# Patient Record
Sex: Female | Born: 1975 | Race: Black or African American | Hispanic: No | Marital: Single | Smoking: Never smoker
Health system: Southern US, Community
[De-identification: ages and names within clinical notes are randomized; demographics above are authoritative.]

## PROBLEM LIST (undated history)

## (undated) DIAGNOSIS — E119 Type 2 diabetes mellitus without complications: Secondary | ICD-10-CM

## (undated) DIAGNOSIS — D219 Benign neoplasm of connective and other soft tissue, unspecified: Secondary | ICD-10-CM

## (undated) DIAGNOSIS — I10 Essential (primary) hypertension: Secondary | ICD-10-CM

## (undated) DIAGNOSIS — K219 Gastro-esophageal reflux disease without esophagitis: Secondary | ICD-10-CM

## (undated) HISTORY — PX: COLONOSCOPY: SHX174

## (undated) HISTORY — PX: RECTAL POLYPECTOMY: SHX2309

## (undated) HISTORY — PX: MYOMECTOMY: SHX85

## (undated) HISTORY — DX: Gastro-esophageal reflux disease without esophagitis: K21.9

## (undated) HISTORY — DX: Benign neoplasm of connective and other soft tissue, unspecified: D21.9

---

## 2017-09-16 ENCOUNTER — Encounter (HOSPITAL_COMMUNITY): Payer: Self-pay | Admitting: Emergency Medicine

## 2017-09-16 ENCOUNTER — Ambulatory Visit (HOSPITAL_COMMUNITY)
Admission: EM | Admit: 2017-09-16 | Discharge: 2017-09-16 | Disposition: A | Discharge status: Home / Self Care | Payer: 59

## 2017-09-16 DIAGNOSIS — R030 Elevated blood-pressure reading, without diagnosis of hypertension: Secondary | ICD-10-CM

## 2017-09-16 DIAGNOSIS — I10 Essential (primary) hypertension: Secondary | ICD-10-CM | POA: Diagnosis not present

## 2017-09-16 DIAGNOSIS — E1165 Type 2 diabetes mellitus with hyperglycemia: Secondary | ICD-10-CM

## 2017-09-16 DIAGNOSIS — IMO0001 Reserved for inherently not codable concepts without codable children: Secondary | ICD-10-CM

## 2017-09-16 DIAGNOSIS — L0291 Cutaneous abscess, unspecified: Secondary | ICD-10-CM

## 2017-09-16 HISTORY — DX: Essential (primary) hypertension: I10

## 2017-09-16 HISTORY — DX: Type 2 diabetes mellitus without complications: E11.9

## 2017-09-16 MED ORDER — SULFAMETHOXAZOLE-TRIMETHOPRIM 800-160 MG PO TABS: 1.0000 | ORAL_TABLET | Freq: Two times a day (BID) | ORAL | 0 refills | Status: DC

## 2017-09-16 MED ORDER — LISINOPRIL 20 MG PO TABS: 20.0000 mg | ORAL_TABLET | Freq: Two times a day (BID) | ORAL | 0 refills | Status: DC

## 2017-09-16 MED ORDER — METOPROLOL TARTRATE 25 MG PO TABS: 25.0000 mg | ORAL_TABLET | Freq: Two times a day (BID) | ORAL | 0 refills | Status: DC

## 2017-09-16 MED FILL — METOPROLOL TARTRATE 25 MG T: 25 | 90 days supply | Qty: 180 | Fill #0

## 2017-09-16 MED FILL — SULFAMETHOXAZOLE-TMP DS TAB: 800-160 | 10 days supply | Qty: 20 | Fill #0

## 2017-09-16 MED FILL — LISINOPRIL 20 MG TABLET: 20 | 90 days supply | Qty: 180 | Fill #0

## 2017-09-16 NOTE — Discharge Instructions (Addendum)
Jaynee Eagles, PA-C [facility]

## 2017-09-16 NOTE — ED Triage Notes (Addendum)
Pt here for abscess to left inner thigh that is draining but still has some hardness around area; pt needs htn meds refilled

## 2017-09-16 NOTE — ED Provider Notes (Signed)
  MRN: [id number] DOB: 29-Sep-1975  Subjective:   Jeanette Meyer is a 42 y.o. female presenting for 6 day history of progressively worsening skin infection of her left thigh. Patient reports that the infection has been draining, is painful. She had another infection ~3 weeks ago that resolved on its own. Denies fever, chest pain, shob, n/v, abdominal pain, chills, rashes. Denies smoking cigarettes. She is currently out of her blood pressure medications. Was taking 20mg  lisinopril twice daily. She is also out of her Victoza. She does not have a PCP yet, [place]  No current facility-administered medications for this encounter.   Current Outpatient Medications:  .  cetirizine (ZYRTEC) 10 MG tablet, Take 10 mg by mouth daily., Disp: , Rfl:  .  glimepiride (AMARYL) 4 MG tablet, Take 4 mg by mouth 2 (two) times daily., Disp: , Rfl:  .  insulin glargine (LANTUS) 100 UNIT/ML injection, Inject 15 Units into the skin at bedtime., Disp: , Rfl:  .  lisinopril (PRINIVIL,ZESTRIL) 20 MG tablet, Take 20 mg by mouth daily., Disp: , Rfl:  .  metFORMIN (GLUCOPHAGE) 500 MG tablet, Take by mouth 2 (two) times daily with a meal., Disp: , Rfl:  .  metoprolol tartrate (LOPRESSOR) 25 MG tablet, Take 25 mg by mouth 2 (two) times daily., Disp: , Rfl:     Allergies  Allergen Reactions  . Biaxin [Clarithromycin]   . Doxycycline   . Latex     Past Medical History:  Diagnosis Date  . Diabetes mellitus without complication ([place])   . Hypertension      Has had a myomectomy (2013), polypectomy (rectum), carpal tunnel release (right hand).    Objective:   Vitals: BP (!) 177/102 (BP Location: Right Arm)   Pulse 100   Temp 99.1 F (37.3 C) (Oral)   Resp 18   SpO2 100%   Physical Exam  Constitutional: She is oriented to person, place, and time. She appears well-developed and well-nourished.  HENT:  Mouth/Throat: Oropharynx is clear and moist.  Eyes: Pupils are equal, round, and  reactive to light. EOM are normal. Right eye exhibits no discharge. Left eye exhibits no discharge. No scleral icterus.  Neck: Normal range of motion. Neck supple. No thyromegaly present.  Cardiovascular: Normal rate, regular rhythm and intact distal pulses. Exam reveals no gallop and no friction rub.  No murmur heard. Pulmonary/Chest: No respiratory distress. She has no wheezes. She has no rales.  Musculoskeletal: She exhibits edema (trace bilaterally).  Neurological: She is alert and oriented to person, place, and time.  Skin: Skin is warm and dry.  2 cm area of her medial inguinal fold of left groin of induration and erythema that is tender.  There is a central open wound with slight drainage.  Psychiatric: She has a normal mood and affect.   Assessment and Plan :   Essential hypertension  Elevated blood pressure reading  Abscess  Uncontrolled type 2 diabetes mellitus without complication ([place])  I will have patient restart her lisinopril 20 mg twice daily.  Patient is to start Bactrim for her abscess.  Counseled on wound care. Counseled patient on potential for adverse effects with medications prescribed today, patient verbalized understanding. Return-to-clinic precautions discussed, patient verbalized understanding.      Jaynee Eagles, PA-C 09/16/17 1233

## 2017-09-21 ENCOUNTER — Ambulatory Visit: Payer: Self-pay | Admitting: Physician Assistant

## 2017-09-21 DIAGNOSIS — J1189 Influenza due to unidentified influenza virus with other manifestations: Secondary | ICD-10-CM | POA: Diagnosis not present

## 2017-09-21 DIAGNOSIS — R509 Fever, unspecified: Secondary | ICD-10-CM | POA: Diagnosis not present

## 2017-09-26 ENCOUNTER — Encounter (HOSPITAL_COMMUNITY): Payer: Self-pay

## 2017-09-26 DIAGNOSIS — R197 Diarrhea, unspecified: Secondary | ICD-10-CM | POA: Insufficient documentation

## 2017-09-26 DIAGNOSIS — I10 Essential (primary) hypertension: Secondary | ICD-10-CM | POA: Insufficient documentation

## 2017-09-26 DIAGNOSIS — E119 Type 2 diabetes mellitus without complications: Secondary | ICD-10-CM | POA: Diagnosis not present

## 2017-09-26 DIAGNOSIS — R109 Unspecified abdominal pain: Secondary | ICD-10-CM | POA: Insufficient documentation

## 2017-09-26 DIAGNOSIS — Z794 Long term (current) use of insulin: Secondary | ICD-10-CM | POA: Diagnosis not present

## 2017-09-26 DIAGNOSIS — R112 Nausea with vomiting, unspecified: Secondary | ICD-10-CM | POA: Insufficient documentation

## 2017-09-26 DIAGNOSIS — R1110 Vomiting, unspecified: Secondary | ICD-10-CM | POA: Diagnosis not present

## 2017-09-26 DIAGNOSIS — Z79899 Other long term (current) drug therapy: Secondary | ICD-10-CM | POA: Diagnosis not present

## 2017-09-26 LAB — COMPREHENSIVE METABOLIC PANEL
ALK PHOS: 77 U/L (ref 38–126)
ALT: 23 U/L (ref 14–54)
ANION GAP: 11 (ref 5–15)
AST: 49 U/L — ABNORMAL HIGH (ref 15–41)
Albumin: 3.2 g/dL — ABNORMAL LOW (ref 3.5–5.0)
BUN: 13 mg/dL (ref 6–20)
CALCIUM: 8.6 mg/dL — AB (ref 8.9–10.3)
CO2: 24 mmol/L (ref 22–32)
CREATININE: 0.91 mg/dL (ref 0.44–1.00)
Chloride: 100 mmol/L — ABNORMAL LOW (ref 101–111)
Glucose, Bld: 358 mg/dL — ABNORMAL HIGH (ref 65–99)
Potassium: 4.5 mmol/L (ref 3.5–5.1)
SODIUM: 135 mmol/L (ref 135–145)
Total Bilirubin: 1.3 mg/dL — ABNORMAL HIGH (ref 0.3–1.2)
Total Protein: 6.7 g/dL (ref 6.5–8.1)

## 2017-09-26 LAB — LIPASE, BLOOD: Lipase: 37 U/L (ref 11–51)

## 2017-09-26 LAB — CBC
HCT: 37.1 % (ref 36.0–46.0)
HEMOGLOBIN: 12.1 g/dL (ref 12.0–15.0)
MCH: 25.1 pg — ABNORMAL LOW (ref 26.0–34.0)
MCHC: 32.6 g/dL (ref 30.0–36.0)
MCV: 77 fL — ABNORMAL LOW (ref 78.0–100.0)
PLATELETS: 310 10*3/uL (ref 150–400)
RBC: 4.82 MIL/uL (ref 3.87–5.11)
RDW: 14.6 % (ref 11.5–15.5)
WBC: 9.4 10*3/uL (ref 4.0–10.5)

## 2017-09-26 LAB — I-STAT BETA HCG BLOOD, ED (MC, WL, AP ONLY)

## 2017-09-26 NOTE — ED Triage Notes (Signed)
Pt complains of vomiting and diarrhea after eating [employer]jangles about 4pm, she states it started about 6pm

## 2017-09-27 ENCOUNTER — Encounter (HOSPITAL_COMMUNITY): Payer: Self-pay | Admitting: Student

## 2017-09-27 ENCOUNTER — Emergency Department (HOSPITAL_COMMUNITY): Payer: 59

## 2017-09-27 ENCOUNTER — Emergency Department (HOSPITAL_COMMUNITY)
Admission: EM | Admit: 2017-09-27 | Discharge: 2017-09-27 | Disposition: A | Discharge status: Home / Self Care | Payer: 59 | Attending: Emergency Medicine | Admitting: Emergency Medicine

## 2017-09-27 DIAGNOSIS — I10 Essential (primary) hypertension: Secondary | ICD-10-CM | POA: Diagnosis not present

## 2017-09-27 DIAGNOSIS — R197 Diarrhea, unspecified: Secondary | ICD-10-CM | POA: Diagnosis not present

## 2017-09-27 DIAGNOSIS — Z79899 Other long term (current) drug therapy: Secondary | ICD-10-CM | POA: Diagnosis not present

## 2017-09-27 DIAGNOSIS — R1110 Vomiting, unspecified: Secondary | ICD-10-CM | POA: Diagnosis not present

## 2017-09-27 DIAGNOSIS — R109 Unspecified abdominal pain: Secondary | ICD-10-CM | POA: Diagnosis not present

## 2017-09-27 DIAGNOSIS — R112 Nausea with vomiting, unspecified: Secondary | ICD-10-CM | POA: Diagnosis not present

## 2017-09-27 DIAGNOSIS — Z794 Long term (current) use of insulin: Secondary | ICD-10-CM | POA: Diagnosis not present

## 2017-09-27 DIAGNOSIS — E119 Type 2 diabetes mellitus without complications: Secondary | ICD-10-CM | POA: Diagnosis not present

## 2017-09-27 LAB — URINALYSIS, ROUTINE W REFLEX MICROSCOPIC
Bilirubin Urine: NEGATIVE
Ketones, ur: 5 mg/dL — AB
Leukocytes, UA: NEGATIVE
Nitrite: NEGATIVE
PROTEIN: NEGATIVE mg/dL
SPECIFIC GRAVITY, URINE: 1.016 (ref 1.005–1.030)
pH: 5 (ref 5.0–8.0)

## 2017-09-27 LAB — I-STAT CG4 LACTIC ACID, ED
Lactic Acid, Venous: 2.16 mmol/L (ref 0.5–1.9)
Lactic Acid, Venous: 2.1 mmol/L (ref 0.5–1.9)

## 2017-09-27 MED ORDER — KETOROLAC TROMETHAMINE 30 MG/ML IJ SOLN
30.0000 mg | Freq: Once | INTRAMUSCULAR | Status: AC
  Administered 2017-09-27: 30 mg via INTRAVENOUS
  Filled 2017-09-27: qty 1

## 2017-09-27 MED ORDER — SODIUM CHLORIDE 0.9 % IV BOLUS
1000.0000 mL | Freq: Once | INTRAVENOUS | Status: AC
  Administered 2017-09-27: 1000 mL via INTRAVENOUS

## 2017-09-27 MED ORDER — IOPAMIDOL (ISOVUE-300) INJECTION 61%
INTRAVENOUS | Status: AC
  Filled 2017-09-27: qty 100

## 2017-09-27 MED ORDER — IOPAMIDOL (ISOVUE-300) INJECTION 61%
100.0000 mL | Freq: Once | INTRAVENOUS | Status: AC | PRN
  Administered 2017-09-27: 100 mL via INTRAVENOUS

## 2017-09-27 MED ORDER — ONDANSETRON 4 MG PO TBDP: 4.0000 mg | ORAL_TABLET | Freq: Three times a day (TID) | ORAL | 0 refills | Status: DC | PRN

## 2017-09-27 MED ORDER — ACETAMINOPHEN 325 MG PO TABS
650.0000 mg | ORAL_TABLET | Freq: Once | ORAL | Status: AC
  Administered 2017-09-27: 650 mg via ORAL
  Filled 2017-09-27: qty 2

## 2017-09-27 MED ORDER — ONDANSETRON HCL 4 MG/2ML IJ SOLN
4.0000 mg | Freq: Once | INTRAMUSCULAR | Status: AC
  Administered 2017-09-27: 4 mg via INTRAVENOUS
  Filled 2017-09-27: qty 2

## 2017-09-27 NOTE — ED Provider Notes (Signed)
06:50: Received sign out from  Martinique Robinson PA-C pending fluids and lactic acid.   In summary patient is a 42 year old female with a hx of diabetes and hypertension who presented to the emergency department for nausea, vomiting, and diarrhea that began around 1800 last night after eating Bojangles for dinner.  She had several episodes of both emesis and diarrhea.  She did have some associated cramping abdominal pain with the diarrhea.  Symptoms resolved upon arrival to the emergency department.  Physical Exam  BP 106/62   Pulse 98   Temp 99.4 F (37.4 C) (Oral)   Resp 16   LMP 09/09/2017   SpO2 95%   Physical Exam  Constitutional: She appears well-developed and well-nourished.  Non-toxic appearance. She does not have a sickly appearance. She does not appear ill. No distress.  HENT:  Head: Normocephalic and atraumatic.  Eyes: Conjunctivae are normal. Right eye exhibits no discharge. Left eye exhibits no discharge.  Abdominal: Soft. She exhibits no distension. There is no tenderness. There is no rigidity, no rebound and no guarding.  Neurological: She is alert.  Clear speech.   Psychiatric: She has a normal mood and affect. Her behavior is normal. Thought content normal.  Nursing note and vitals reviewed.  ED Course/Procedures   Results for orders placed or performed during the hospital encounter of 09/27/17  Lipase, blood  Result Value Ref Range   Lipase 37 11 - 51 U/L  Comprehensive metabolic panel  Result Value Ref Range   Sodium 135 135 - 145 mmol/L   Potassium 4.5 3.5 - 5.1 mmol/L   Chloride 100 (L) 101 - 111 mmol/L   CO2 24 22 - 32 mmol/L   Glucose, Bld 358 (H) 65 - 99 mg/dL   BUN 13 6 - 20 mg/dL   Creatinine, Ser 0.91 0.44 - 1.00 mg/dL   Calcium 8.6 (L) 8.9 - 10.3 mg/dL   Total Protein 6.7 6.5 - 8.1 g/dL   Albumin 3.2 (L) 3.5 - 5.0 g/dL   AST 49 (H) 15 - 41 U/L   ALT 23 14 - 54 U/L   Alkaline Phosphatase 77 38 - 126 U/L   Total Bilirubin 1.3 (H) 0.3 - 1.2 mg/dL   GFR calc non Af Amer >60 >60 mL/min   GFR calc Af Amer >60 >60 mL/min   Anion gap 11 5 - 15  CBC  Result Value Ref Range   WBC 9.4 4.0 - 10.5 K/uL   RBC 4.82 3.87 - 5.11 MIL/uL   Hemoglobin 12.1 12.0 - 15.0 g/dL   HCT 37.1 36.0 - 46.0 %   MCV 77.0 (L) 78.0 - 100.0 fL   MCH 25.1 (L) 26.0 - 34.0 pg   MCHC 32.6 30.0 - 36.0 g/dL   RDW 14.6 11.5 - 15.5 %   Platelets 310 150 - 400 K/uL  Urinalysis, Routine w reflex microscopic  Result Value Ref Range   Color, Urine YELLOW YELLOW   APPearance CLEAR CLEAR   Specific Gravity, Urine 1.016 1.005 - 1.030   pH 5.0 5.0 - 8.0   Glucose, UA >=500 (A) NEGATIVE mg/dL   Hgb urine dipstick SMALL (A) NEGATIVE   Bilirubin Urine NEGATIVE NEGATIVE   Ketones, ur 5 (A) NEGATIVE mg/dL   Protein, ur NEGATIVE NEGATIVE mg/dL   Nitrite NEGATIVE NEGATIVE   Leukocytes, UA NEGATIVE NEGATIVE   RBC / HPF 0-5 0 - 5 RBC/hpf   WBC, UA 0-5 0 - 5 WBC/hpf   Bacteria, UA RARE (A)  NONE SEEN   Squamous Epithelial / LPF 0-5 0 - 5  I-Stat beta hCG blood, ED  Result Value Ref Range   I-stat hCG, quantitative <5.0 <5 mIU/mL   Comment 3          I-Stat CG4 Lactic Acid, ED  Result Value Ref Range   Lactic Acid, Venous 2.16 (HH) 0.5 - 1.9 mmol/L   Comment NOTIFIED PHYSICIAN   I-Stat CG4 Lactic Acid, ED  Result Value Ref Range   Lactic Acid, Venous 2.10 (HH) 0.5 - 1.9 mmol/L   Comment NOTIFIED PHYSICIAN    Ct Abdomen Pelvis W Contrast  Result Date: 09/27/2017 CLINICAL DATA:  42 year old female with acute abdominal pain, nausea/vomiting and fever for 1 day. EXAM: CT ABDOMEN AND PELVIS WITH CONTRAST TECHNIQUE: Multidetector CT imaging of the abdomen and pelvis was performed using the standard protocol following bolus administration of intravenous contrast. CONTRAST:  133mL ISOVUE-300 IOPAMIDOL (ISOVUE-300) INJECTION 61% COMPARISON:  None. FINDINGS: Lower chest: Minimal LEFT basilar atelectasis/scarring noted. Hepatobiliary: Possible mild hepatic steatosis noted. No focal  hepatic lesions identified. Gallbladder is unremarkable. No biliary dilatation. Pancreas: Unremarkable Spleen: Unremarkable Adrenals/Urinary Tract: The kidneys, adrenal glands and bladder are unremarkable except for a small LEFT renal cyst. Stomach/Bowel: Stomach is within normal limits. Appendix appears normal. No evidence of bowel wall thickening, distention, or inflammatory changes. Vascular/Lymphatic: No significant vascular findings are present. No enlarged abdominal or pelvic lymph nodes. Reproductive: Mild heterogeneity of the uterus noted and may represent fibroids. No other significant abnormalities noted. Other: No ascites, focal collection or pneumoperitoneum. Musculoskeletal: A small umbilical hernia containing fat and small immediately adjacent supraumbilical hernia containing fat noted. IMPRESSION: 1. No evidence of acute abnormality. 2. Small umbilical and supraumbilical hernias containing fat. 3. Question mild hepatic steatosis. 4. Possible uterine fibroids. Electronically Signed   By: Margarette Canada M.D.   On: 09/27/2017 12:47   Clinical Course as of Sep 28 1454  Fri Sep 27, 2017  0519 Pt re-evaluated. Remains slightly tachycardic with soft pressures. Pt sleeping on evaluation, denies and complaints. Abdomen remains soft and nontender. Suspect hypovolemia as cause of abnormal VS. Will administer another IVF bolus and re-evaluate.   [JR]  0711 Lactic acid mildly elevated at 2.16. Discussed with supervising physician Dr. Leonides Schanz. Will continue plan of IVFs with repeat of lactic acid and vitals after fluids.   I-Stat CG4 Lactic Acid, ED(!!) [SP]  8676 Patient's lactic acid remains elevated. Her blood pressure remains in the 19J systolic. Abdomen is nontender on my exam. Discussed with supervising physician Dr. Melina Copa at change of shift regarding plan- will CT abdomen/pelvis, plan for an additional 1L of fluids, given vitals and lactic sepsis considered, instructed hold on abx at this time as we do  not feel this is likely bacterial currently, I am in agreement with this  Lactic Acid, Venous(!!): 2.10 [SP]  1300 Patient's CT abdomen/pelvis revealed no acute abnormality- additional findings per read above.  Her abdomen remains nontender. Vitals have improved after additional L of fluids. She is ambulatory without complaints in the ER and appears hemodynamically stable. Discussed with Dr. Melina Copa do not feel repeat lactic acid would be clinically relevant at this time given improvement, will plan for DC home with PCP follow up and strict return precautions, Dr. Melina Copa in agreement with plan    [SP]    Clinical Course User Index [JR] Robinson, [place] N, PA-C [SP] Amaryllis Dyke, PA-C    Procedures   MDM   Patient without complaints.  Improved following fluids. She appears hemodynamically stable and is ambulatory throughout the emergency department.  Will plan for discharge home with strict return precautions and PCP follow-up.  Discussed throughout physician Dr. Melina Copa who is in agreement with plan.   Vitals:   09/27/17 1100 09/27/17 1130 09/27/17 1214 09/27/17 1230  BP: 105/67 114/77 103/63 110/70  Pulse: 86 93 97 91  Resp:  14 14 14   Temp:      TempSrc:      SpO2: 100% 99% 100% 100%      Amaryllis Dyke, PA-C 09/27/17 1456    Hayden Rasmussen, MD 09/29/17 1401

## 2017-09-27 NOTE — ED Provider Notes (Addendum)
[facility] DEPT Provider Note   CSN: [id number] Arrival date & time: 09/26/17  2032     History   Chief Complaint Chief Complaint  Patient presents with  . Abdominal Pain    HPI NEDDA GAINS is a 42 y.o. female with past medical history of diabetes, hypertension, presenting to the ED with acute onset of nausea, vomiting, and diarrhea that began around 6 p.m. this evening after eating bojangles for dinner.  She states she had significant amount of vomiting and diarrhea for about 1 to 2 hours.  She also had cramping abdominal pain with the episodes of diarrhea.  States the symptoms have since resolved.  Denies urinary symptoms, abnormal vaginal bleeding or discharge.  Reports recent history of abscess to left groin which was treated with Bactrim with improvement. No history of abdominal surgery.  The history is provided by the patient.    Past Medical History:  Diagnosis Date  . Diabetes mellitus without complication ([place])   . Hypertension     There are no active problems to display for this patient.   History reviewed. No pertinent surgical history.   OB History   None      Home Medications    Prior to Admission medications   Medication Sig Start Date End Date Taking? Authorizing Provider  cetirizine (ZYRTEC) 10 MG tablet Take 10 mg by mouth daily.   Yes [provider]  glimepiride (AMARYL) 4 MG tablet Take 4 mg by mouth 2 (two) times daily.   Yes [provider]  insulin glargine (LANTUS) 100 UNIT/ML injection Inject 15 Units into the skin at bedtime.   Yes [provider]  lisinopril (PRINIVIL,ZESTRIL) 20 MG tablet Take 1 tablet (20 mg total) by mouth 2 (two) times daily. 09/16/17  Yes Jaynee Eagles, PA-C  metFORMIN (GLUCOPHAGE) 500 MG tablet Take 500 mg by mouth daily with breakfast.    Yes [provider]  metoprolol tartrate (LOPRESSOR) 25 MG tablet Take 1 tablet (25 mg total) by mouth 2 (two)  times daily. 09/16/17  Yes Jaynee Eagles, PA-C  Multiple Vitamin (MULTIVITAMIN WITH MINERALS) TABS tablet Take 1 tablet by mouth daily.   Yes [provider]  sulfamethoxazole-trimethoprim (BACTRIM DS,SEPTRA DS) 800-160 MG tablet Take 1 tablet by mouth 2 (two) times daily. Patient not taking: Reported on 09/27/2017 09/16/17   Jaynee Eagles, PA-C    Family History History reviewed. No pertinent family history.  Social History Social History   Tobacco Use  . Smoking status: Never Smoker  . Smokeless tobacco: Never Used  Substance Use Topics  . Alcohol use: Not Currently  . Drug use: Never     Allergies   Clarithromycin; Doxycycline; and Latex   Review of Systems Review of Systems  Constitutional: Positive for chills.  Gastrointestinal: Positive for abdominal pain, diarrhea, nausea and vomiting. Negative for blood in stool and constipation.  Genitourinary: Negative for dysuria, frequency, vaginal bleeding and vaginal discharge.  All other systems reviewed and are negative.    Physical Exam Updated Vital Signs BP (!) 98/57 (BP Location: Left Arm)   Pulse 95   Temp 99.4 F (37.4 C) (Oral)   Resp 15   LMP 09/09/2017   SpO2 96%   Physical Exam  Constitutional: She appears well-developed and well-nourished. She does not appear ill. No distress.  HENT:  Head: Normocephalic and atraumatic.  Mouth/Throat: Oropharynx is clear and moist.  Eyes: Conjunctivae are normal.  Cardiovascular: Regular rhythm and intact distal pulses.  Mildly tachycardic  Pulmonary/Chest: Effort normal and breath sounds normal. No respiratory distress.  Abdominal: Soft. Bowel sounds are normal. She exhibits no distension and no mass. There is no tenderness. There is no rebound and no guarding.  Neurological: She is alert.  Skin: Skin is warm.  Small nearly healed abscess to left groin.  No fluctuance, erythema, warmth, or purulent drainage noted.   Psychiatric: She has a normal mood and affect.  Her behavior is normal.  Nursing note and vitals reviewed.    ED Treatments / Results  Labs (all labs ordered are listed, but only abnormal results are displayed) Labs Reviewed  COMPREHENSIVE METABOLIC PANEL - Abnormal; Notable for the following components:      Result Value   Chloride 100 (*)    Glucose, Bld 358 (*)    Calcium 8.6 (*)    Albumin 3.2 (*)    AST 49 (*)    Total Bilirubin 1.3 (*)    All other components within normal limits  CBC - Abnormal; Notable for the following components:   MCV 77.0 (*)    MCH 25.1 (*)    All other components within normal limits  URINALYSIS, ROUTINE W REFLEX MICROSCOPIC - Abnormal; Notable for the following components:   Glucose, UA >=500 (*)    Hgb urine dipstick SMALL (*)    Ketones, ur 5 (*)    Bacteria, UA RARE (*)    All other components within normal limits  LIPASE, BLOOD  I-STAT BETA HCG BLOOD, ED ([facility], [facility], [facility] ONLY)  I-STAT CG4 LACTIC ACID, ED    EKG None  Radiology No results found.  Procedures Procedures (including critical care time)  Medications Ordered in ED Medications  sodium chloride 0.9 % bolus 1,000 mL (has no administration in time range)  sodium chloride 0.9 % bolus 1,000 mL (0 mLs Intravenous Stopped 09/27/17 0436)  ondansetron (ZOFRAN) injection 4 mg (4 mg Intravenous Given 09/27/17 0336)  ketorolac (TORADOL) 30 MG/ML injection 30 mg (30 mg Intravenous Given 09/27/17 0336)  acetaminophen (TYLENOL) tablet 650 mg (650 mg Oral Given 09/27/17 0416)  sodium chloride 0.9 % bolus 1,000 mL (1,000 mLs Intravenous New Bag/Given 09/27/17 0526)     Initial Impression / Assessment and Plan / ED Course  I have reviewed the triage vital signs and the nursing notes.  Pertinent labs & imaging results that were available during my care of the patient were reviewed by me and considered in my medical decision making (see chart for details).  Patient presenting to the ED with acute onset of nausea, vomiting, diarrhea that  began after eating dinner.  On exam, patient states she is asymptomatic with regards to abdominal complaints, though does report mild headache from vomiting.  Abdomen is soft and nontender.  Initial vital signs in triage with normal temperature and heart rate, however slightly hypotensive.  On my evaluation, BP is normal though patient is slightly tachycardic.  Labs done in triage; CBC without leukocytosis or anemia, patient is hyperglycemic with normal gap and bicarb.  Normal lipase, negative pregnancy and UA without evidence of infection.  IV fluids ordered, and Toradol for headache.  Suspect viral gastroenteritis causing her symptoms.  Reevaluate following intervention.  Clinical Course as of Oct 01 1136  Fri Sep 27, 2017  0519 Pt re-evaluated. Remains slightly tachycardic with soft pressures. Pt sleeping on evaluation, denies and complaints. Abdomen remains soft and nontender. Suspect hypovolemia as cause of abnormal VS. Will administer another IVF bolus and re-evaluate.   [  JR]  0711 Lactic acid mildly elevated at 2.16. Discussed with supervising physician Dr. Leonides Schanz. Will continue plan of IVFs with repeat of lactic acid and vitals after fluids.   I-Stat CG4 Lactic Acid, ED(!!) [SP]  6578 Patient's lactic acid remains elevated. Her blood pressure remains in the 46N systolic. Abdomen is nontender on my exam. Discussed with supervising physician Dr. Melina Copa at change of shift regarding plan- will CT abdomen/pelvis, plan for an additional 1L of fluids, given vitals and lactic sepsis considered, instructed hold on abx at this time as we do not feel this is likely bacterial currently, I am in agreement with this  Lactic Acid, Venous(!!): 2.10 [SP]  1300 Patient's CT abdomen/pelvis revealed no acute abnormality- additional findings per read above.  Her abdomen remains nontender. Vitals have improved after additional L of fluids. She is ambulatory without complaints in the ER and appears hemodynamically stable.  Discussed with Dr. Melina Copa do not feel repeat lactic acid would be clinically relevant at this time given improvement, will plan for DC home with PCP follow up and strict return precautions, Dr. Melina Copa in agreement with plan    [SP]    Clinical Course User Index [JR] Robinson, [place] N, PA-C [SP] Amaryllis Dyke, PA-C   Care assumed at shift change by [facility], PA-C, pending reevaluation of vital signs following IV fluids and lactic.  Patient discussed with Dr. Leonides Schanz. If VS without improvement, recommend admission for IV hydration. If lactic elevated, recommend broad spectrum abx, CT abd and admission. Pt aware and agreeable to plan.  Final Clinical Impressions(s) / ED Diagnoses   Final diagnoses:  None    ED Discharge Orders    None       Robinson, [place] N, PA-C 09/27/17 [place], Delice Bison, DO 09/27/17 6295    Hayden Rasmussen, MD 10/01/17 1139

## 2017-09-27 NOTE — ED Notes (Signed)
MD AND RN NOTIFIED OF PATIENT'S LACTIC ACID LEVEL OF 2.10

## 2017-09-27 NOTE — ED Notes (Signed)
Pt provided ginger ale and ambulated without complication. PA aware.

## 2017-09-27 NOTE — ED Notes (Signed)
I gave critical I Stat CG4 result to PA Sammy

## 2017-09-27 NOTE — Discharge Instructions (Addendum)
You were seen in the emergency department today for nausea, vomiting, and diarrhea. Your blood sugar was high please have this rechecked. We observed and rehydrated you in the ER due to your low blood pressure, this improved with fluids. Your CT scan of your abdomen showed a small umbilical hernia and supraumbilical hernia (hernia at your navel area). There is a possibly your liver had a fatty appearance and it is possible you have a uterine fibroid.  These findings are not emergent, there are things that you may discuss with your primary care provider with follow-up.  Please follow-up with your primary care provider in 2 to 3 days for reevaluation.  Return to the ER immediately for any new or worsening symptoms including but not limited to fever, inability to keep fluids down, abdominal pain, blood in your stool, blood in vomit, lightheadedness, dizziness, feeling as if he may pass out, passing out, difficulty breathing, chest pain, or any other concerns that you may have.  We are sending you home with Zofran tablets, take these every 8 hours as needed for nausea. We have prescribed you new medication(s) today. Discuss the medications prescribed today with your pharmacist as they can have adverse effects and interactions with your other medicines including over the counter and prescribed medications. Seek medical evaluation if you start to experience new or abnormal symptoms after taking one of these medicines, seek care immediately if you start to experience difficulty breathing, feeling of your throat closing, facial swelling, or rash as these could be indications of a more serious allergic reaction

## 2017-10-03 ENCOUNTER — Encounter: Payer: Self-pay | Admitting: Urgent Care

## 2017-10-03 ENCOUNTER — Ambulatory Visit (INDEPENDENT_AMBULATORY_CARE_PROVIDER_SITE_OTHER): Payer: 59 | Admitting: Urgent Care

## 2017-10-03 VITALS — BP 162/90 | HR 86 | Temp 98.8°F | Resp 18 | Ht 63.25 in | Wt 200.8 lb

## 2017-10-03 DIAGNOSIS — I10 Essential (primary) hypertension: Secondary | ICD-10-CM | POA: Diagnosis not present

## 2017-10-03 DIAGNOSIS — E6609 Other obesity due to excess calories: Secondary | ICD-10-CM

## 2017-10-03 DIAGNOSIS — Z114 Encounter for screening for human immunodeficiency virus [HIV]: Secondary | ICD-10-CM | POA: Diagnosis not present

## 2017-10-03 DIAGNOSIS — Z6835 Body mass index (BMI) 35.0-35.9, adult: Secondary | ICD-10-CM

## 2017-10-03 DIAGNOSIS — E1165 Type 2 diabetes mellitus with hyperglycemia: Secondary | ICD-10-CM | POA: Diagnosis not present

## 2017-10-03 DIAGNOSIS — R030 Elevated blood-pressure reading, without diagnosis of hypertension: Secondary | ICD-10-CM | POA: Diagnosis not present

## 2017-10-03 MED ORDER — SITAGLIPTIN PHOSPHATE 100 MG PO TABS: 100.0000 mg | ORAL_TABLET | Freq: Every day | ORAL | 1 refills | Status: DC

## 2017-10-03 MED ORDER — INSULIN GLARGINE 100 UNIT/ML SOLOSTAR PEN: 15.0000 [IU] | PEN_INJECTOR | Freq: Every day | SUBCUTANEOUS | 5 refills | Status: DC

## 2017-10-03 MED ORDER — METFORMIN HCL ER 750 MG PO TB24: 750.0000 mg | ORAL_TABLET | Freq: Every day | ORAL | 1 refills | Status: DC

## 2017-10-03 MED FILL — LANTUS SOLOSTAR 100 UNITS/M: 100 | 80 days supply | Qty: 12 | Fill #0

## 2017-10-03 MED FILL — JANUVIA 100 MG TABLET: 100 | 90 days supply | Qty: 90 | Fill #0

## 2017-10-03 MED FILL — METFORMIN HCL ER 750 MG TAB: 750 | 90 days supply | Qty: 90 | Fill #0

## 2017-10-03 NOTE — Patient Instructions (Addendum)
Please make sure that you are eating less or limit your carbohydrates such as white rice, potatoes, white breads, pasta, pizza, sodas, beer, sweet tea, fruit juices. Exercise can also help with your blood sugar. You can instead eat more salads, fruits, vegetables, whole grains, wheat, oats.      Salads - kale, spinach, cabbage, spring mix; use seeds like pumpkin seeds or sunflower seeds; you can also use 1-2 hard boiled eggs. Fruits - avocadoes, berries (blueberries, raspberries, blackberries), apples, oranges, pomegranate, grapefruit Seeds - quinoa, chia seeds, flax seed Vegetables - aspargus, cauliflower, broccoli, green beans, brussel spouts, bell peppers; stay away from starchy vegetables like potatoes, carrots, peas    Diabetes Mellitus and Nutrition When you have diabetes (diabetes mellitus), it is very important to have healthy eating habits because your blood sugar (glucose) levels are greatly affected by what you eat and drink. Eating healthy foods in the appropriate amounts, at about the same times every day, can help you:  Control your blood glucose.  Lower your risk of heart disease.  Improve your blood pressure.  Reach or maintain a healthy weight.  Every person with diabetes is different, and each person has different needs for a meal plan. Your health care provider may recommend that you work with a diet and nutrition specialist (dietitian) to make a meal plan that is best for you. Your meal plan may vary depending on factors such as:  The calories you need.  The medicines you take.  Your weight.  Your blood glucose, blood pressure, and cholesterol levels.  Your activity level.  Other health conditions you have, such as heart or kidney disease.  How do carbohydrates affect me? Carbohydrates affect your blood glucose level more than any other type of food. Eating carbohydrates naturally increases the amount of glucose in your blood. Carbohydrate counting is a method  for keeping track of how many carbohydrates you eat. Counting carbohydrates is important to keep your blood glucose at a healthy level, especially if you use insulin or take certain oral diabetes medicines. It is important to know how many carbohydrates you can safely have in each meal. This is different for every person. Your dietitian can help you calculate how many carbohydrates you should have at each meal and for snack. Foods that contain carbohydrates include:  Bread, cereal, rice, pasta, and crackers.  Potatoes and corn.  Peas, beans, and lentils.  Milk and yogurt.  Fruit and juice.  Desserts, such as cakes, cookies, ice cream, and candy.  How does alcohol affect me? Alcohol can cause a sudden decrease in blood glucose (hypoglycemia), especially if you use insulin or take certain oral diabetes medicines. Hypoglycemia can be a life-threatening condition. Symptoms of hypoglycemia (sleepiness, dizziness, and confusion) are similar to symptoms of having too much alcohol. If your health care provider says that alcohol is safe for you, follow these guidelines:  Limit alcohol intake to no more than 1 drink per day for nonpregnant women and 2 drinks per day for men. One drink equals 12 oz of beer, 5 oz of wine, or 1 oz of hard liquor.  Do not drink on an empty stomach.  Keep yourself hydrated with water, diet soda, or unsweetened iced tea.  Keep in mind that regular soda, juice, and other mixers may contain a lot of sugar and must be counted as carbohydrates.  What are tips for following this plan? Reading food labels  Start by checking the serving size on the label. The amount of calories,  carbohydrates, fats, and other nutrients listed on the label are based on one serving of the food. Many foods contain more than one serving per package.  Check the total grams (g) of carbohydrates in one serving. You can calculate the number of servings of carbohydrates in one serving by dividing  the total carbohydrates by 15. For example, if a food has 30 g of total carbohydrates, it would be equal to 2 servings of carbohydrates.  Check the number of grams (g) of saturated and trans fats in one serving. Choose foods that have low or no amount of these fats.  Check the number of milligrams (mg) of sodium in one serving. Most people should limit total sodium intake to less than 2,300 mg per day.  Always check the nutrition information of foods labeled as "low-fat" or "nonfat". These foods may be higher in added sugar or refined carbohydrates and should be avoided.  Talk to your dietitian to identify your daily goals for nutrients listed on the label. Shopping  Avoid buying canned, premade, or processed foods. These foods tend to be high in fat, sodium, and added sugar.  Shop around the outside edge of the grocery store. This includes fresh fruits and vegetables, bulk grains, fresh meats, and fresh dairy. Cooking  Use low-heat cooking methods, such as baking, instead of high-heat cooking methods like deep frying.  Cook using healthy oils, such as olive, canola, or sunflower oil.  Avoid cooking with butter, cream, or high-fat meats. Meal planning  Eat meals and snacks regularly, preferably at the same times every day. Avoid going long periods of time without eating.  Eat foods high in fiber, such as fresh fruits, vegetables, beans, and whole grains. Talk to your dietitian about how many servings of carbohydrates you can eat at each meal.  Eat 4-6 ounces of lean protein each day, such as lean meat, chicken, fish, eggs, or tofu. 1 ounce is equal to 1 ounce of meat, chicken, or fish, 1 egg, or 1/4 cup of tofu.  Eat some foods each day that contain healthy fats, such as avocado, nuts, seeds, and fish. Lifestyle   Check your blood glucose regularly.  Exercise at least 30 minutes 5 or more days each week, or as told by your health care provider.  Take medicines as told by your  health care provider.  Do not use any products that contain nicotine or tobacco, such as cigarettes and e-cigarettes. If you need help quitting, ask your health care provider.  Work with a Social worker or diabetes educator to identify strategies to manage stress and any emotional and social challenges. What are some questions to ask my health care provider?  Do I need to meet with a diabetes educator?  Do I need to meet with a dietitian?  What number can I call if I have questions?  When are the best times to check my blood glucose? Where to find more information:  American Diabetes Association: diabetes.org/food-and-fitness/food  Academy of Nutrition and Dietetics: PokerClues.dk  [employer] of Diabetes and Digestive and Kidney Diseases (NIH): ContactWire.be Summary  A healthy meal plan will help you control your blood glucose and maintain a healthy lifestyle.  Working with a diet and nutrition specialist (dietitian) can help you make a meal plan that is best for you.  Keep in mind that carbohydrates and alcohol have immediate effects on your blood glucose levels. It is important to count carbohydrates and to use alcohol carefully. This information is not intended to replace  advice given to you by your health care provider. Make sure you discuss any questions you have with your health care provider. Document Released: 02/01/2005 Document Revised: 06/11/2016 Document Reviewed: 06/11/2016 Elsevier Interactive Patient Education  2018 [employer].     Hypertension Hypertension, commonly called high blood pressure, is when the force of blood pumping through the arteries is too strong. The arteries are the blood vessels that carry blood from the heart throughout the body. Hypertension forces the heart to work harder to pump blood and may cause arteries to  become narrow or stiff. Having untreated or uncontrolled hypertension can cause heart attacks, strokes, kidney disease, and other problems. A blood pressure reading consists of a higher number over a lower number. Ideally, your blood pressure should be below 120/80. The first ("top") number is called the systolic pressure. It is a measure of the pressure in your arteries as your heart beats. The second ("bottom") number is called the diastolic pressure. It is a measure of the pressure in your arteries as the heart relaxes. What are the causes? The cause of this condition is not known. What increases the risk? Some risk factors for high blood pressure are under your control. Others are not. Factors you can change  Smoking.  Having type 2 diabetes mellitus, high cholesterol, or both.  Not getting enough exercise or physical activity.  Being overweight.  Having too much fat, sugar, calories, or salt (sodium) in your diet.  Drinking too much alcohol. Factors that are difficult or impossible to change  Having chronic kidney disease.  Having a family history of high blood pressure.  Age. Risk increases with age.  Race. You may be at higher risk if you are African-American.  Gender. Men are at higher risk than women before age 59. After age 44, women are at higher risk than men.  Having obstructive sleep apnea.  Stress. What are the signs or symptoms? Extremely high blood pressure (hypertensive crisis) may cause:  Headache.  Anxiety.  Shortness of breath.  Nosebleed.  Nausea and vomiting.  Severe chest pain.  Jerky movements you cannot control (seizures).  How is this diagnosed? This condition is diagnosed by measuring your blood pressure while you are seated, with your arm resting on a surface. The cuff of the blood pressure monitor will be placed directly against the skin of your upper arm at the level of your heart. It should be measured at least twice using the same  arm. Certain conditions can cause a difference in blood pressure between your right and left arms. Certain factors can cause blood pressure readings to be lower or higher than normal (elevated) for a short period of time:  When your blood pressure is higher when you are in a health care provider's office than when you are at home, this is called white coat hypertension. Most people with this condition do not need medicines.  When your blood pressure is higher at home than when you are in a health care provider's office, this is called masked hypertension. Most people with this condition may need medicines to control blood pressure.  If you have a high blood pressure reading during one visit or you have normal blood pressure with other risk factors:  You may be asked to return on a different day to have your blood pressure checked again.  You may be asked to monitor your blood pressure at home for 1 week or longer.  If you are diagnosed with hypertension, you may have  other blood or imaging tests to help your health care provider understand your overall risk for other conditions. How is this treated? This condition is treated by making healthy lifestyle changes, such as eating healthy foods, exercising more, and reducing your alcohol intake. Your health care provider may prescribe medicine if lifestyle changes are not enough to get your blood pressure under control, and if:  Your systolic blood pressure is above 130.  Your diastolic blood pressure is above 80.  Your personal target blood pressure may vary depending on your medical conditions, your age, and other factors. Follow these instructions at home: Eating and drinking  Eat a diet that is high in fiber and potassium, and low in sodium, added sugar, and fat. An example eating plan is called the DASH (Dietary Approaches to Stop Hypertension) diet. To eat this way: ? Eat plenty of fresh fruits and vegetables. Try to fill half of your  plate at each meal with fruits and vegetables. ? Eat whole grains, such as whole wheat pasta, brown rice, or whole grain bread. Fill about one quarter of your plate with whole grains. ? Eat or drink low-fat dairy products, such as skim milk or low-fat yogurt. ? Avoid fatty cuts of meat, processed or cured meats, and poultry with skin. Fill about one quarter of your plate with lean proteins, such as fish, chicken without skin, beans, eggs, and tofu. ? Avoid premade and processed foods. These tend to be higher in sodium, added sugar, and fat.  Reduce your daily sodium intake. Most people with hypertension should eat less than 1,500 mg of sodium a day.  Limit alcohol intake to no more than 1 drink a day for nonpregnant women and 2 drinks a day for men. One drink equals 12 oz of beer, 5 oz of wine, or 1 oz of hard liquor. Lifestyle  Work with your health care provider to maintain a healthy body weight or to lose weight. Ask what an ideal weight is for you.  Get at least 30 minutes of exercise that causes your heart to beat faster (aerobic exercise) most days of the week. Activities may include walking, swimming, or biking.  Include exercise to strengthen your muscles (resistance exercise), such as pilates or lifting weights, as part of your weekly exercise routine. Try to do these types of exercises for 30 minutes at least 3 days a week.  Do not use any products that contain nicotine or tobacco, such as cigarettes and e-cigarettes. If you need help quitting, ask your health care provider.  Monitor your blood pressure at home as told by your health care provider.  Keep all follow-up visits as told by your health care provider. This is important. Medicines  Take over-the-counter and prescription medicines only as told by your health care provider. Follow directions carefully. Blood pressure medicines must be taken as prescribed.  Do not skip doses of blood pressure medicine. Doing this puts you  at risk for problems and can make the medicine less effective.  Ask your health care provider about side effects or reactions to medicines that you should watch for. Contact a health care provider if:  You think you are having a reaction to a medicine you are taking.  You have headaches that keep coming back (recurring).  You feel dizzy.  You have swelling in your ankles.  You have trouble with your vision. Get help right away if:  You develop a severe headache or confusion.  You have unusual weakness or numbness.  You feel faint.  You have severe pain in your chest or abdomen.  You vomit repeatedly.  You have trouble breathing. Summary  Hypertension is when the force of blood pumping through your arteries is too strong. If this condition is not controlled, it may put you at risk for serious complications.  Your personal target blood pressure may vary depending on your medical conditions, your age, and other factors. For most people, a normal blood pressure is less than 120/80.  Hypertension is treated with lifestyle changes, medicines, or a combination of both. Lifestyle changes include weight loss, eating a healthy, low-sodium diet, exercising more, and limiting alcohol. This information is not intended to replace advice given to you by your health care provider. Make sure you discuss any questions you have with your health care provider. Document Released: 05/07/2005 Document Revised: 04/04/2016 Document Reviewed: 04/04/2016 Elsevier Interactive Patient Education  2018 [employer].     IF you received an x-ray today, you will receive an invoice from [facility]. Please contact [facility] at [phone] with questions or concerns regarding your invoice.   IF you received labwork today, you will receive an invoice from [facility]. Please contact [facility] at [phone] with questions or concerns regarding your invoice.   Our billing staff will not be  able to assist you with questions regarding bills from these companies.  You will be contacted with the lab results as soon as they are available. The fastest way to get your results is to activate your My Chart account. Instructions are located on the last page of this paperwork. If you have not heard from [place] regarding the results in 2 weeks, please contact this office.

## 2017-10-03 NOTE — Progress Notes (Signed)
MRN: [id number] DOB: [birth year]  Subjective:   Jeanette Meyer is a 42 y.o. female presenting to establish care.  Patient has a history of diabetes and high blood pressure.  I originally met patient at the [facility] urgent care clinic.  I restarted her lisinopril as she has recently moved to [facility] and has not yet established care.  She is out of her diabetes medicines as well which includes Lantus and Victoza, glimepiride.  She is currently out of her metformin and admits that she has had a very difficult time tolerating this medicine.  Reports that she has a hard time with medications in general especially antibiotics.  Today, she reports that she has had some tingling of her left middle finger.  Denies dizziness, confusion, blurred vision, headache, sore throat, chest pain, shortness of breath, wheezing, nausea, vomiting, belly pain, hematuria, urinary frequency, skin infections.  She is not checking her feet as consistently as she should and admits that she needs to take better care of them.Denies smoking cigarettes.  Has about 1 drink of alcohol per week.  Jeanette Meyer has a current medication list which includes the following prescription(s): cetirizine, glimepiride, insulin glargine, lisinopril, metformin, and metoprolol tartrate. Also is allergic to bactrim [sulfamethoxazole-trimethoprim]; clarithromycin; doxycycline; and latex.  Tiani  has a past medical history of Diabetes mellitus without complication ([place]) and Hypertension. Also  has a past surgical history that includes Myomectomy and Rectal polypectomy. Her family history includes Cancer in her maternal grandmother; Diabetes in her maternal grandmother and mother; Hypertension in her maternal grandmother and mother. Also reports Crohn disease in her uncle.    Objective:   Vitals: BP (!) 162/90   Pulse 86   Temp 98.8 F (37.1 C) (Oral)   Resp 18   Ht 5' 3.25" (1.607 m)   Wt 200 lb 12.8 oz (91.1 kg)   LMP 09/09/2017 (Exact Date)    SpO2 100%   BMI 35.29 kg/m   BP Readings from Last 3 Encounters:  10/03/17 (!) 162/90  09/27/17 110/70  09/16/17 (!) 177/102    Physical Exam  Constitutional: She is oriented to person, place, and time. She appears well-developed and well-nourished.  HENT:  Mouth/Throat: Oropharynx is clear and moist.  Eyes: No scleral icterus.  Neck: Normal range of motion. Neck supple. No thyromegaly present.  Cardiovascular: Normal rate, regular rhythm and intact distal pulses. Exam reveals no gallop and no friction rub.  No murmur heard. Pulmonary/Chest: No respiratory distress. She has no wheezes. She has no rales.  Abdominal: Soft. Bowel sounds are normal. She exhibits no distension and no mass. There is no tenderness. There is no guarding.  Musculoskeletal: She exhibits no edema.  Neurological: She is alert and oriented to person, place, and time.  Skin: Skin is warm and dry.  Psychiatric: She has a normal mood and affect.   Assessment and Plan :   Uncontrolled type 2 diabetes mellitus with hyperglycemia ([place]) - Plan: Hemoglobin A1c  Class 2 obesity due to excess calories without serious comorbidity with body mass index (BMI) of 35.0 to 35.9 in adult - Plan: Thyroid Panel With TSH  Essential hypertension - Plan: Microalbumin / creatinine urine ratio  Elevated blood pressure reading  Screening for HIV without presence of risk factors - Plan: HIV antibody  Patient is medically stable.  I suspect that her hand tingling is due to uncontrolled diabetes.  I will have patient discontinue her glimepiride and Victoza.  She is out of these medications anyway.  We will have her start sitagliptin and metformin ER with Lantus to 15 units.  Counseled extensively on dietary modifications.  She is to maintain her lisinopril for now.  Depending on lab results we will add more medications.  Patient is to follow-up in 4 weeks for recheck on her blood pressure.  Jaynee Eagles, PA-C Primary Care at [facility] [phone] 10/03/2017  11:34 AM

## 2017-10-04 LAB — HEMOGLOBIN A1C
ESTIMATED AVERAGE GLUCOSE: 298 mg/dL
HEMOGLOBIN A1C: 12 % — AB (ref 4.8–5.6)

## 2017-10-04 LAB — MICROALBUMIN / CREATININE URINE RATIO: Creatinine, Urine: 38 mg/dL

## 2017-10-04 LAB — THYROID PANEL WITH TSH
Free Thyroxine Index: 1.7 (ref 1.2–4.9)
T3 Uptake Ratio: 24 % (ref 24–39)
T4 TOTAL: 7.2 ug/dL (ref 4.5–12.0)
TSH: 1.76 u[IU]/mL (ref 0.450–4.500)

## 2017-10-04 LAB — HIV ANTIBODY (ROUTINE TESTING W REFLEX): HIV SCREEN 4TH GENERATION: NONREACTIVE

## 2017-10-07 ENCOUNTER — Encounter: Payer: Self-pay | Admitting: Urgent Care

## 2017-10-27 ENCOUNTER — Encounter: Payer: Self-pay | Admitting: Urgent Care

## 2017-11-05 ENCOUNTER — Ambulatory Visit: Payer: 59 | Admitting: Urgent Care

## 2017-11-06 ENCOUNTER — Ambulatory Visit (INDEPENDENT_AMBULATORY_CARE_PROVIDER_SITE_OTHER): Payer: 59 | Admitting: Advanced Practice Midwife

## 2017-11-06 ENCOUNTER — Encounter: Payer: Self-pay | Admitting: Advanced Practice Midwife

## 2017-11-06 VITALS — BP 130/87 | HR 78 | Ht 64.0 in | Wt 197.2 lb

## 2017-11-06 DIAGNOSIS — B373 Candidiasis of vulva and vagina: Secondary | ICD-10-CM | POA: Diagnosis not present

## 2017-11-06 DIAGNOSIS — Z01419 Encounter for gynecological examination (general) (routine) without abnormal findings: Secondary | ICD-10-CM | POA: Diagnosis not present

## 2017-11-06 DIAGNOSIS — Z124 Encounter for screening for malignant neoplasm of cervix: Secondary | ICD-10-CM | POA: Diagnosis not present

## 2017-11-06 DIAGNOSIS — B3731 Acute candidiasis of vulva and vagina: Secondary | ICD-10-CM

## 2017-11-06 DIAGNOSIS — Z86018 Personal history of other benign neoplasm: Secondary | ICD-10-CM

## 2017-11-06 DIAGNOSIS — Z1239 Encounter for other screening for malignant neoplasm of breast: Secondary | ICD-10-CM

## 2017-11-06 DIAGNOSIS — Z1151 Encounter for screening for human papillomavirus (HPV): Secondary | ICD-10-CM | POA: Diagnosis not present

## 2017-11-06 DIAGNOSIS — L02214 Cutaneous abscess of groin: Secondary | ICD-10-CM

## 2017-11-06 DIAGNOSIS — D259 Leiomyoma of uterus, unspecified: Secondary | ICD-10-CM

## 2017-11-06 DIAGNOSIS — N939 Abnormal uterine and vaginal bleeding, unspecified: Secondary | ICD-10-CM

## 2017-11-06 MED ORDER — FLUCONAZOLE 150 MG PO TABS: 150.0000 mg | ORAL_TABLET | Freq: Once | ORAL | 0 refills | Status: DC

## 2017-11-06 MED FILL — FLUCONAZOLE 150 MG TABS: 150 | 5 days supply | Qty: 2 | Fill #0

## 2017-11-06 NOTE — Progress Notes (Signed)
Subjective:     Jeanette Meyer is a 42 y.o. female here for a routine exam.  Current complaints: increased bleeding/cramping with regular periods, vaginal irritation from possible yeast, and boil on left groin.  She is not sexually active and declines STD testing and contraception.  She reports frequent yeast infections with her Type 2 DM and feels like that is causing her irritation.  She tried Azo Yeast for the symptoms without improvement.  She had a left groin boil drained 2 months ago and was started on Bactrim but had a severe GI reaction to Bactrim and did not take any more abx. She has hx of reaction to multiple abx.  There are no other symptoms, she has not tried any other treatments.  She has hx of fibroid uterus with myomectomy in 2013 but knows there are some smaller fibroids still present.    Gynecologic History Patient's last menstrual period was 10/09/2017. Contraception: none Last Pap: 2017. Results were: normal Last mammogram: none.  Obstetric History OB History  Gravida Para Term Preterm AB Living  0 0 0 0 0 0  SAB TAB Ectopic Multiple Live Births  0 0 0 0 0     The following portions of the patient's history were reviewed and updated as appropriate: allergies, current medications, past family history, past medical history, past social history, past surgical history and problem list.  Review of Systems Pertinent items noted in HPI and remainder of comprehensive ROS otherwise negative.    Objective:   BP 130/87   Pulse 78   Ht 5\' 4"  (1.626 m)   Wt 197 lb 3.2 oz (89.4 kg)   LMP 10/09/2017   BMI 33.85 kg/m    VS reviewed, nursing note reviewed,  Constitutional: well developed, well nourished, no distress HEENT: normocephalic CV: normal rate Pulm/chest wall: normal effort Breast Exam:  right breast normal without mass, skin or nipple changes or axillary nodes, left breast normal without mass, skin or nipple changes or axillary nodes Abdomen: soft Neuro: alert  and oriented x 3 Skin: warm, dry Psych: affect normal Pelvic exam: Cervix pink, visually closed, without lesion, scant white  Thin discharge with some thicker discharge clinging to cervix, vaginal walls and external genitalia normal Bimanual exam: Cervix 0/long/high, firm, anterior, neg CMT, uterus nontender, nonenlarged but irregular in shape, adnexa without tenderness, enlargement, or mass  Assessment/Plan:   1. Screening for cervical cancer - Cytology - PAP  2. Screening for breast cancer - MM DIGITAL SCREENING BILATERAL; Future  3. Abscess of groin, left --No evidence of systemic infection.  Area nontender, small firm area residual from previous drained abscess.  --Pt with poor reaction to abx.  Will treat conservatively with warm compresses BID PRN --Warning signs reviewed, reasons to seek care  4. Well woman exam with routine gynecological exam   5. History of uterine fibroid --Records from [place] requested.   --Discussed treatment options for pain/bleeding including ibuprofen, contraception including Depo/progesterone only pills, and IUD but will need [place] report to consider IUD   6. Vaginal candidiasis -Evidence of yeast on physical exam. Treat with Diflucan 150 x 2 doses, 5 days apart. -Added yeast testing to Pap  Fatima Blank, CNM 3:57 PM

## 2017-11-06 NOTE — Progress Notes (Signed)
Patient is in the office for new GYN, annual, moved from [place]. Patient declines std testing, reports vaginal discharge/irritation. Pt has not had a mammogram.

## 2017-11-08 LAB — CYTOLOGY - PAP
Bacterial vaginitis: POSITIVE — AB
Candida vaginitis: POSITIVE — AB
DIAGNOSIS: NEGATIVE
HPV: NOT DETECTED

## 2017-11-12 ENCOUNTER — Ambulatory Visit (INDEPENDENT_AMBULATORY_CARE_PROVIDER_SITE_OTHER): Payer: 59 | Admitting: Urgent Care

## 2017-11-12 ENCOUNTER — Encounter: Payer: Self-pay | Admitting: Urgent Care

## 2017-11-12 VITALS — BP 145/87 | HR 85 | Temp 97.9°F | Resp 18 | Ht 64.0 in | Wt 197.0 lb

## 2017-11-12 DIAGNOSIS — E6609 Other obesity due to excess calories: Secondary | ICD-10-CM | POA: Diagnosis not present

## 2017-11-12 DIAGNOSIS — I10 Essential (primary) hypertension: Secondary | ICD-10-CM | POA: Diagnosis not present

## 2017-11-12 DIAGNOSIS — R030 Elevated blood-pressure reading, without diagnosis of hypertension: Secondary | ICD-10-CM | POA: Diagnosis not present

## 2017-11-12 DIAGNOSIS — Z6835 Body mass index (BMI) 35.0-35.9, adult: Secondary | ICD-10-CM

## 2017-11-12 DIAGNOSIS — E1165 Type 2 diabetes mellitus with hyperglycemia: Secondary | ICD-10-CM

## 2017-11-12 MED ORDER — AMLODIPINE BESYLATE 5 MG PO TABS: 5.0000 mg | ORAL_TABLET | Freq: Every day | ORAL | 0 refills | Status: DC

## 2017-11-12 MED ORDER — GLUCOSE BLOOD VI STRP: ORAL_STRIP | 12 refills | Status: DC

## 2017-11-12 NOTE — Progress Notes (Signed)
    MRN: [id number] DOB: 1975/11/28  Subjective:   Jeanette Meyer is a 42 y.o. female presenting for follow up on Hypertension. Currently managed with 40 mg lisinopril. Patient is checking blood pressure at home, generally 482L systolic. She is trying to make dietary modifications but still admits difficulty with this. Denies chest pain, shortness of breath, heart racing, palpitations, nausea, vomiting, abdominal pain, hematuria, lower leg swelling. Denies smoking cigarettes.   Jeanette Meyer has a current medication list which includes the following prescription(s): cetirizine, insulin glargine, lisinopril, metformin, metoprolol tartrate, and sitagliptin. Also is allergic to bactrim [sulfamethoxazole-trimethoprim]; clarithromycin; doxycycline; and latex.  Jeanette Meyer  has a past medical history of Diabetes mellitus without complication ([place]) and Hypertension. Also  has a past surgical history that includes Myomectomy and Rectal polypectomy.  Objective:   Vitals: BP (!) 145/87   Pulse 85   Temp 97.9 F (36.6 C) (Other (Comment))   Resp 18   Ht 5\' 4"  (1.626 m)   Wt 197 lb (89.4 kg)   SpO2 100%   BMI 33.81 kg/m   BP Readings from Last 3 Encounters:  11/12/17 (!) 145/87  11/06/17 130/87  10/03/17 (!) 162/90    Wt Readings from Last 3 Encounters:  11/12/17 197 lb (89.4 kg)  11/06/17 197 lb 3.2 oz (89.4 kg)  10/03/17 200 lb 12.8 oz (91.1 kg)   Physical Exam  Constitutional: She is oriented to person, place, and time. She appears well-developed and well-nourished.  Cardiovascular: Normal rate.  Pulmonary/Chest: Effort normal.  Neurological: She is alert and oriented to person, place, and time.   Assessment and Plan :   Essential hypertension  Elevated blood pressure reading  Class 2 obesity due to excess calories without serious comorbidity with body mass index (BMI) of 35.0 to 35.9 in adult  Uncontrolled type 2 diabetes mellitus with hyperglycemia ([place])  Patient is to maintain her  lisinopril and add amlodipine.  She is not checking her fasting blood sugars and will have her start doing this.  Recheck in 4 weeks.  Encourage patient to continue making lifestyle changes.  Jaynee Eagles, PA-C [facility] [phone] 11/12/2017  5:57 PM

## 2017-11-12 NOTE — Patient Instructions (Addendum)
Salads - kale, spinach, cabbage, spring mix; use seeds like pumpkin seeds or sunflower seeds; you can also use 1-2 hard boiled eggs. Fruits - avocadoes, berries (blueberries, raspberries, blackberries), apples, oranges, pomegranate, grapefruit Seeds - quinoa, chia seeds, flax seeds; you can also incorporate oatmeal Vegetables - aspargus, cauliflower, broccoli, green beans, brussel spouts, bell peppers; stay away from starchy vegetables like potatoes, carrots, peas     Diabetes Mellitus and Nutrition When you have diabetes (diabetes mellitus), it is very important to have healthy eating habits because your blood sugar (glucose) levels are greatly affected by what you eat and drink. Eating healthy foods in the appropriate amounts, at about the same times every day, can help you:  Control your blood glucose.  Lower your risk of heart disease.  Improve your blood pressure.  Reach or maintain a healthy weight.  Every person with diabetes is different, and each person has different needs for a meal plan. Your health care provider may recommend that you work with a diet and nutrition specialist (dietitian) to make a meal plan that is best for you. Your meal plan may vary depending on factors such as:  The calories you need.  The medicines you take.  Your weight.  Your blood glucose, blood pressure, and cholesterol levels.  Your activity level.  Other health conditions you have, such as heart or kidney disease.  How do carbohydrates affect me? Carbohydrates affect your blood glucose level more than any other type of food. Eating carbohydrates naturally increases the amount of glucose in your blood. Carbohydrate counting is a method for keeping track of how many carbohydrates you eat. Counting carbohydrates is important to keep your blood glucose at a healthy level, especially if you use insulin or take certain oral diabetes medicines. It is important to know how many carbohydrates you can  safely have in each meal. This is different for every person. Your dietitian can help you calculate how many carbohydrates you should have at each meal and for snack. Foods that contain carbohydrates include:  Bread, cereal, rice, pasta, and crackers.  Potatoes and corn.  Peas, beans, and lentils.  Milk and yogurt.  Fruit and juice.  Desserts, such as cakes, cookies, ice cream, and candy.  How does alcohol affect me? Alcohol can cause a sudden decrease in blood glucose (hypoglycemia), especially if you use insulin or take certain oral diabetes medicines. Hypoglycemia can be a life-threatening condition. Symptoms of hypoglycemia (sleepiness, dizziness, and confusion) are similar to symptoms of having too much alcohol. If your health care provider says that alcohol is safe for you, follow these guidelines:  Limit alcohol intake to no more than 1 drink per day for nonpregnant women and 2 drinks per day for men. One drink equals 12 oz of beer, 5 oz of wine, or 1 oz of hard liquor.  Do not drink on an empty stomach.  Keep yourself hydrated with water, diet soda, or unsweetened iced tea.  Keep in mind that regular soda, juice, and other mixers may contain a lot of sugar and must be counted as carbohydrates.  What are tips for following this plan? Reading food labels  Start by checking the serving size on the label. The amount of calories, carbohydrates, fats, and other nutrients listed on the label are based on one serving of the food. Many foods contain more than one serving per package.  Check the total grams (g) of carbohydrates in one serving. You can calculate the number of servings of carbohydrates  in one serving by dividing the total carbohydrates by 15. For example, if a food has 30 g of total carbohydrates, it would be equal to 2 servings of carbohydrates.  Check the number of grams (g) of saturated and trans fats in one serving. Choose foods that have low or no amount of these  fats.  Check the number of milligrams (mg) of sodium in one serving. Most people should limit total sodium intake to less than 2,300 mg per day.  Always check the nutrition information of foods labeled as "low-fat" or "nonfat". These foods may be higher in added sugar or refined carbohydrates and should be avoided.  Talk to your dietitian to identify your daily goals for nutrients listed on the label. Shopping  Avoid buying canned, premade, or processed foods. These foods tend to be high in fat, sodium, and added sugar.  Shop around the outside edge of the grocery store. This includes fresh fruits and vegetables, bulk grains, fresh meats, and fresh dairy. Cooking  Use low-heat cooking methods, such as baking, instead of high-heat cooking methods like deep frying.  Cook using healthy oils, such as olive, canola, or sunflower oil.  Avoid cooking with butter, cream, or high-fat meats. Meal planning  Eat meals and snacks regularly, preferably at the same times every day. Avoid going long periods of time without eating.  Eat foods high in fiber, such as fresh fruits, vegetables, beans, and whole grains. Talk to your dietitian about how many servings of carbohydrates you can eat at each meal.  Eat 4-6 ounces of lean protein each day, such as lean meat, chicken, fish, eggs, or tofu. 1 ounce is equal to 1 ounce of meat, chicken, or fish, 1 egg, or 1/4 cup of tofu.  Eat some foods each day that contain healthy fats, such as avocado, nuts, seeds, and fish. Lifestyle   Check your blood glucose regularly.  Exercise at least 30 minutes 5 or more days each week, or as told by your health care provider.  Take medicines as told by your health care provider.  Do not use any products that contain nicotine or tobacco, such as cigarettes and e-cigarettes. If you need help quitting, ask your health care provider.  Work with a Social worker or diabetes educator to identify strategies to manage stress  and any emotional and social challenges. What are some questions to ask my health care provider?  Do I need to meet with a diabetes educator?  Do I need to meet with a dietitian?  What number can I call if I have questions?  When are the best times to check my blood glucose? Where to find more information:  American Diabetes Association: diabetes.org/food-and-fitness/food  Academy of Nutrition and Dietetics: PokerClues.dk  [employer] of Diabetes and Digestive and Kidney Diseases (NIH): ContactWire.be Summary  A healthy meal plan will help you control your blood glucose and maintain a healthy lifestyle.  Working with a diet and nutrition specialist (dietitian) can help you make a meal plan that is best for you.  Keep in mind that carbohydrates and alcohol have immediate effects on your blood glucose levels. It is important to count carbohydrates and to use alcohol carefully. This information is not intended to replace advice given to you by your health care provider. Make sure you discuss any questions you have with your health care provider. Document Released: 02/01/2005 Document Revised: 06/11/2016 Document Reviewed: 06/11/2016 Elsevier Interactive Patient Education  2018 [employer].     Hypertension Hypertension, commonly  called high blood pressure, is when the force of blood pumping through the arteries is too strong. The arteries are the blood vessels that carry blood from the heart throughout the body. Hypertension forces the heart to work harder to pump blood and may cause arteries to become narrow or stiff. Having untreated or uncontrolled hypertension can cause heart attacks, strokes, kidney disease, and other problems. A blood pressure reading consists of a higher number over a lower number. Ideally, your blood pressure should be below 120/80.  The first ("top") number is called the systolic pressure. It is a measure of the pressure in your arteries as your heart beats. The second ("bottom") number is called the diastolic pressure. It is a measure of the pressure in your arteries as the heart relaxes. What are the causes? The cause of this condition is not known. What increases the risk? Some risk factors for high blood pressure are under your control. Others are not. Factors you can change  Smoking.  Having type 2 diabetes mellitus, high cholesterol, or both.  Not getting enough exercise or physical activity.  Being overweight.  Having too much fat, sugar, calories, or salt (sodium) in your diet.  Drinking too much alcohol. Factors that are difficult or impossible to change  Having chronic kidney disease.  Having a family history of high blood pressure.  Age. Risk increases with age.  Race. You may be at higher risk if you are African-American.  Gender. Men are at higher risk than women before age 39. After age 60, women are at higher risk than men.  Having obstructive sleep apnea.  Stress. What are the signs or symptoms? Extremely high blood pressure (hypertensive crisis) may cause:  Headache.  Anxiety.  Shortness of breath.  Nosebleed.  Nausea and vomiting.  Severe chest pain.  Jerky movements you cannot control (seizures).  How is this diagnosed? This condition is diagnosed by measuring your blood pressure while you are seated, with your arm resting on a surface. The cuff of the blood pressure monitor will be placed directly against the skin of your upper arm at the level of your heart. It should be measured at least twice using the same arm. Certain conditions can cause a difference in blood pressure between your right and left arms. Certain factors can cause blood pressure readings to be lower or higher than normal (elevated) for a short period of time:  When your blood pressure is higher when you  are in a health care provider's office than when you are at home, this is called white coat hypertension. Most people with this condition do not need medicines.  When your blood pressure is higher at home than when you are in a health care provider's office, this is called masked hypertension. Most people with this condition may need medicines to control blood pressure.  If you have a high blood pressure reading during one visit or you have normal blood pressure with other risk factors:  You may be asked to return on a different day to have your blood pressure checked again.  You may be asked to monitor your blood pressure at home for 1 week or longer.  If you are diagnosed with hypertension, you may have other blood or imaging tests to help your health care provider understand your overall risk for other conditions. How is this treated? This condition is treated by making healthy lifestyle changes, such as eating healthy foods, exercising more, and reducing your alcohol intake. Your health care  provider may prescribe medicine if lifestyle changes are not enough to get your blood pressure under control, and if:  Your systolic blood pressure is above 130.  Your diastolic blood pressure is above 80.  Your personal target blood pressure may vary depending on your medical conditions, your age, and other factors. Follow these instructions at home: Eating and drinking  Eat a diet that is high in fiber and potassium, and low in sodium, added sugar, and fat. An example eating plan is called the DASH (Dietary Approaches to Stop Hypertension) diet. To eat this way: ? Eat plenty of fresh fruits and vegetables. Try to fill half of your plate at each meal with fruits and vegetables. ? Eat whole grains, such as whole wheat pasta, brown rice, or whole grain bread. Fill about one quarter of your plate with whole grains. ? Eat or drink low-fat dairy products, such as skim milk or low-fat yogurt. ? Avoid  fatty cuts of meat, processed or cured meats, and poultry with skin. Fill about one quarter of your plate with lean proteins, such as fish, chicken without skin, beans, eggs, and tofu. ? Avoid premade and processed foods. These tend to be higher in sodium, added sugar, and fat.  Reduce your daily sodium intake. Most people with hypertension should eat less than 1,500 mg of sodium a day.  Limit alcohol intake to no more than 1 drink a day for nonpregnant women and 2 drinks a day for men. One drink equals 12 oz of beer, 5 oz of wine, or 1 oz of hard liquor. Lifestyle  Work with your health care provider to maintain a healthy body weight or to lose weight. Ask what an ideal weight is for you.  Get at least 30 minutes of exercise that causes your heart to beat faster (aerobic exercise) most days of the week. Activities may include walking, swimming, or biking.  Include exercise to strengthen your muscles (resistance exercise), such as pilates or lifting weights, as part of your weekly exercise routine. Try to do these types of exercises for 30 minutes at least 3 days a week.  Do not use any products that contain nicotine or tobacco, such as cigarettes and e-cigarettes. If you need help quitting, ask your health care provider.  Monitor your blood pressure at home as told by your health care provider.  Keep all follow-up visits as told by your health care provider. This is important. Medicines  Take over-the-counter and prescription medicines only as told by your health care provider. Follow directions carefully. Blood pressure medicines must be taken as prescribed.  Do not skip doses of blood pressure medicine. Doing this puts you at risk for problems and can make the medicine less effective.  Ask your health care provider about side effects or reactions to medicines that you should watch for. Contact a health care provider if:  You think you are having a reaction to a medicine you are  taking.  You have headaches that keep coming back (recurring).  You feel dizzy.  You have swelling in your ankles.  You have trouble with your vision. Get help right away if:  You develop a severe headache or confusion.  You have unusual weakness or numbness.  You feel faint.  You have severe pain in your chest or abdomen.  You vomit repeatedly.  You have trouble breathing. Summary  Hypertension is when the force of blood pumping through your arteries is too strong. If this condition is not controlled, it  may put you at risk for serious complications.  Your personal target blood pressure may vary depending on your medical conditions, your age, and other factors. For most people, a normal blood pressure is less than 120/80.  Hypertension is treated with lifestyle changes, medicines, or a combination of both. Lifestyle changes include weight loss, eating a healthy, low-sodium diet, exercising more, and limiting alcohol. This information is not intended to replace advice given to you by your health care provider. Make sure you discuss any questions you have with your health care provider. Document Released: 05/07/2005 Document Revised: 04/04/2016 Document Reviewed: 04/04/2016 Elsevier Interactive Patient Education  2018 [employer].     IF you received an x-ray today, you will receive an invoice from [facility]. Please contact [facility] at [phone] with questions or concerns regarding your invoice.   IF you received labwork today, you will receive an invoice from [facility]. Please contact [facility] at [phone] with questions or concerns regarding your invoice.   Our billing staff will not be able to assist you with questions regarding bills from these companies.  You will be contacted with the lab results as soon as they are available. The fastest way to get your results is to activate your My Chart account. Instructions are located on the last page  of this paperwork. If you have not heard from [place] regarding the results in 2 weeks, please contact this office.

## 2017-11-13 MED ORDER — LISINOPRIL 20 MG PO TABS: 20.0000 mg | ORAL_TABLET | Freq: Two times a day (BID) | ORAL | 0 refills | Status: DC

## 2017-11-13 MED ORDER — METOPROLOL TARTRATE 25 MG PO TABS: 25.0000 mg | ORAL_TABLET | Freq: Two times a day (BID) | ORAL | 0 refills | Status: DC

## 2017-11-13 MED FILL — FREESTYLE LITE TEST STRIP: 90 days supply | Qty: 100 | Fill #0

## 2017-11-13 MED FILL — AMLODIPINE BESYLATE 5 MG TA: 5 | 90 days supply | Qty: 90 | Fill #0

## 2017-11-15 ENCOUNTER — Telehealth: Payer: Self-pay | Admitting: Urgent Care

## 2017-11-15 NOTE — Telephone Encounter (Signed)
Copied from [place] [id number]. Topic: Inquiry >> Nov 15, 2017  2:06 PM Margot Ables wrote: Reason for CRM: Please send RX for Freestyle meter and lancets (covered by insurance). Pharmacy can use the RX received for test strips but the pt does not have a meter currently.  [place], [place] - [address] [phone] (Phone) [phone] (Fax)

## 2017-11-17 ENCOUNTER — Other Ambulatory Visit: Payer: Self-pay

## 2017-11-17 MED ORDER — BLOOD GLUCOSE MONITOR SYSTEM W/DEVICE KIT: PACK | 0 refills | Status: AC

## 2017-11-17 MED ORDER — FREESTYLE LANCETS MISC: 12 refills | Status: DC

## 2017-11-17 NOTE — Telephone Encounter (Signed)
Prescription sent for the meter and lancets.

## 2017-11-18 MED FILL — FREESTYLE LANCETS: 90 days supply | Qty: 100 | Fill #0

## 2017-11-18 MED FILL — FREESTYLE LITE METER: 30 days supply | Qty: 1 | Fill #0

## 2017-11-19 ENCOUNTER — Encounter: Payer: Self-pay | Admitting: Advanced Practice Midwife

## 2017-11-20 ENCOUNTER — Other Ambulatory Visit: Payer: Self-pay | Admitting: Advanced Practice Midwife

## 2017-11-20 MED ORDER — METRONIDAZOLE 500 MG PO TABS: 500.0000 mg | ORAL_TABLET | Freq: Two times a day (BID) | ORAL | 0 refills | Status: DC

## 2017-11-20 MED FILL — metroNIDAZOLE 500 MG TABS: 500 | 7 days supply | Qty: 14 | Fill #0

## 2017-12-16 ENCOUNTER — Encounter: Payer: Self-pay | Admitting: Advanced Practice Midwife

## 2017-12-17 ENCOUNTER — Other Ambulatory Visit: Payer: Self-pay

## 2017-12-17 DIAGNOSIS — B373 Candidiasis of vulva and vagina: Secondary | ICD-10-CM

## 2017-12-17 DIAGNOSIS — B3731 Acute candidiasis of vulva and vagina: Secondary | ICD-10-CM

## 2017-12-17 MED ORDER — FLUCONAZOLE 150 MG PO TABS: 150.0000 mg | ORAL_TABLET | Freq: Once | ORAL | 0 refills | Status: AC

## 2017-12-17 MED FILL — FLUCONAZOLE 150 MG TABS: 150 | 6 days supply | Qty: 2 | Fill #0

## 2018-01-03 MED FILL — JANUVIA 100 MG TABLET: 100 | 90 days supply | Qty: 90 | Fill #1

## 2018-01-03 MED FILL — METFORMIN HCL ER 750 MG TAB: 750 | 90 days supply | Qty: 90 | Fill #1

## 2018-01-07 MED FILL — LISINOPRIL 20 MG TABLET: 20 | 90 days supply | Qty: 180 | Fill #0

## 2018-01-14 ENCOUNTER — Encounter: Payer: Self-pay | Admitting: Advanced Practice Midwife

## 2018-01-17 ENCOUNTER — Encounter: Payer: Self-pay | Admitting: Family

## 2018-01-17 ENCOUNTER — Other Ambulatory Visit: Payer: 59

## 2018-01-17 ENCOUNTER — Other Ambulatory Visit (INDEPENDENT_AMBULATORY_CARE_PROVIDER_SITE_OTHER): Payer: 59

## 2018-01-17 ENCOUNTER — Other Ambulatory Visit: Payer: Self-pay

## 2018-01-17 ENCOUNTER — Ambulatory Visit (INDEPENDENT_AMBULATORY_CARE_PROVIDER_SITE_OTHER): Payer: 59 | Admitting: Family

## 2018-01-17 VITALS — BP 144/90 | HR 87 | Temp 98.1°F | Ht 64.0 in | Wt 201.1 lb

## 2018-01-17 DIAGNOSIS — I10 Essential (primary) hypertension: Secondary | ICD-10-CM | POA: Diagnosis not present

## 2018-01-17 DIAGNOSIS — Z9889 Other specified postprocedural states: Secondary | ICD-10-CM | POA: Diagnosis not present

## 2018-01-17 DIAGNOSIS — Z8719 Personal history of other diseases of the digestive system: Secondary | ICD-10-CM | POA: Diagnosis not present

## 2018-01-17 DIAGNOSIS — E1165 Type 2 diabetes mellitus with hyperglycemia: Secondary | ICD-10-CM

## 2018-01-17 DIAGNOSIS — IMO0002 Reserved for concepts with insufficient information to code with codable children: Secondary | ICD-10-CM | POA: Insufficient documentation

## 2018-01-17 LAB — COMPREHENSIVE METABOLIC PANEL
ALBUMIN: 4 g/dL (ref 3.5–5.2)
ALT: 12 U/L (ref 0–35)
AST: 14 U/L (ref 0–37)
Alkaline Phosphatase: 89 U/L (ref 39–117)
BILIRUBIN TOTAL: 0.4 mg/dL (ref 0.2–1.2)
BUN: 9 mg/dL (ref 6–23)
CHLORIDE: 99 meq/L (ref 96–112)
CO2: 30 mEq/L (ref 19–32)
CREATININE: 0.68 mg/dL (ref 0.40–1.20)
Calcium: 9.5 mg/dL (ref 8.4–10.5)
GFR: 121.67 mL/min (ref >60.00)
Glucose, Bld: 249 mg/dL — ABNORMAL HIGH (ref 70–99)
Potassium: 3.9 mEq/L (ref 3.5–5.1)
Sodium: 135 mEq/L (ref 135–145)
Total Protein: 7.7 g/dL (ref 6.0–8.3)

## 2018-01-17 MED ORDER — FLUCONAZOLE 150 MG PO TABS: ORAL_TABLET | ORAL | 1 refills | Status: DC

## 2018-01-17 MED ORDER — METFORMIN HCL ER 750 MG PO TB24: 750.0000 mg | ORAL_TABLET | Freq: Two times a day (BID) | ORAL | 1 refills | Status: DC

## 2018-01-17 MED ORDER — INSULIN GLARGINE 100 UNIT/ML SOLOSTAR PEN: PEN_INJECTOR | SUBCUTANEOUS | 5 refills | Status: DC

## 2018-01-17 MED FILL — FLUCONAZOLE 150 MG TABS: 150 | 4 days supply | Qty: 2 | Fill #0

## 2018-01-17 MED FILL — LANTUS SOLOSTAR 100 UNITS/M: 100 | 75 days supply | Qty: 15 | Fill #0

## 2018-01-17 NOTE — Progress Notes (Signed)
Jeanette Meyer is a 42 y.o. female with the following history as recorded in EpicCare:  Patient Active Problem List   Diagnosis Date Noted  . Essential hypertension 01/17/2018  . Uncontrolled type 2 diabetes mellitus ([place]) 01/17/2018  . H/O rectal polypectomy 01/17/2018  . Fibroid uterus 11/06/2017  . Abnormal uterine bleeding (AUB) 11/06/2017  . Abscess of groin, left 11/06/2017    Current Outpatient Medications  Medication Sig Dispense Refill  . amLODipine (NORVASC) 5 MG tablet Take 1 tablet (5 mg total) by mouth daily. 90 tablet 0  . Blood Glucose Monitoring Suppl (BLOOD GLUCOSE MONITOR SYSTEM) w/Device KIT Dispense Freestyle meter or per insurance coverage. Check glucose once daily - ICD 10- E11.65 1 each 0  . cetirizine (ZYRTEC) 10 MG tablet Take 10 mg by mouth daily.    . fluconazole (DIFLUCAN) 150 MG tablet Take 1 tablet as directed; repeat after 72 hours as directed 2 tablet 1  . glucose blood test strip Check fasting blood sugar once daily. 100 each 12  . Insulin Glargine (LANTUS SOLOSTAR) 100 UNIT/ML Solostar Pen Inject 20 units insulin at night as directed 5 pen 5  . Lancets (FREESTYLE) lancets Use as instructed 100 each 12  . lisinopril (PRINIVIL,ZESTRIL) 20 MG tablet Take 1 tablet (20 mg total) by mouth 2 (two) times daily. 180 tablet 0  . metFORMIN (GLUCOPHAGE-XR) 750 MG 24 hr tablet Take 1 tablet (750 mg total) by mouth 2 (two) times daily. 180 tablet 1  . metoprolol tartrate (LOPRESSOR) 25 MG tablet Take 1 tablet (25 mg total) by mouth 2 (two) times daily. 180 tablet 0   No current facility-administered medications for this visit.     Allergies: Bactrim [sulfamethoxazole-trimethoprim]; Clarithromycin; Doxycycline; and Latex  Past Medical History:  Diagnosis Date  . Diabetes mellitus without complication ([place])   . Hypertension     Past Surgical History:  Procedure Laterality Date  . MYOMECTOMY    . RECTAL POLYPECTOMY      Family History  Problem Relation Age of  Onset  . Diabetes Mother   . Hypertension Mother   . Diabetes Maternal Grandmother   . Cancer Maternal Grandmother   . Hypertension Maternal Grandmother     Social History   Tobacco Use  . Smoking status: Never Smoker  . Smokeless tobacco: Never Used  Substance Use Topics  . Alcohol use: Not Currently    Subjective:  Patient presents to establish as a new patient; history of hypertension/ Type 2 Diabetes; last Hgba1c in May 2019 at 12.0; having problems with recurrent yeast infections; had been on Januvia until 2 days ago; stopped on her own due to muscle aches; taking her Metformin XR 750 mg bid and 15 units of Lantus at night; fasting sugar this am was at 243; agreeable to getting labs updated today; asking to be referred back to endocrinology; knows she is overdue for yearly eye exam; defers pneumonia vaccine at this time.   Patient is not concerned about her blood pressure today- has not taken her medication yet today; does take her medication as prescribed typically; Denies any chest pain, shortness of breath, blurred vision or headache.  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  Head: Normocephalic and atraumatic  Eyes: Sclera and conjunctiva clear; pupils round and reactive to light; extraocular movements intact  Lungs: Respirations unlabored; clear to auscultation bilaterally without wheeze, rales, rhonchi  CVS exam: normal rate and regular rhythm.  Neurologic: Alert and oriented; speech intact; face symmetrical; moves all extremities well; CNII-XII intact without focal  deficit   Assessment:  1. Uncontrolled type 2 diabetes mellitus with hyperglycemia ([place])   2. Essential hypertension   3. H/O rectal polypectomy     Plan:  1. Refer to Endocrine as requested; continue Metformin XR 750 bid; increase Lantus to 20 units nightly; check CBC, CMP, hgba1c today; 2. Take medication as prescribed; follow-up in 3 months; 3. Refer to GI;   No follow-ups on file.  Orders Placed This Encounter  Procedures  . Comp Met (CMET)    Standing Status:   Future    Number of Occurrences:   1    Standing Expiration Date:   01/17/2019  . HgB A1c    Standing Status:   Future    Number of Occurrences:   1    Standing Expiration Date:   01/17/2019  . Ambulatory referral to Endocrinology    Referral Priority:   Routine    Referral Type:   Consultation    Referral Reason:   Specialty Services Required    Number of Visits Requested:   1    Requested Prescriptions   Signed Prescriptions Disp Refills  . metFORMIN (GLUCOPHAGE-XR) 750 MG 24 hr tablet 180 tablet 1    Sig: Take 1 tablet (750 mg total) by mouth 2 (two) times daily.  . Insulin Glargine (LANTUS SOLOSTAR) 100 UNIT/ML Solostar Pen 5 pen 5    Sig: Inject 20 units insulin at night as directed  . fluconazole (DIFLUCAN) 150 MG tablet 2 tablet 1    Sig: Take 1 tablet as directed; repeat after 72 hours as directed

## 2018-01-18 LAB — HEMOGLOBIN A1C
EAG (MMOL/L): 16.8 (calc)
Hgb A1c MFr Bld: 12.2 % of total Hgb — ABNORMAL HIGH (ref <5.7)
Mean Plasma Glucose: 303 (calc)

## 2018-01-21 ENCOUNTER — Telehealth: Payer: Self-pay | Admitting: Family

## 2018-01-21 ENCOUNTER — Encounter: Payer: Self-pay | Admitting: Family

## 2018-01-21 NOTE — Telephone Encounter (Signed)
Pt given lab results per notes of Jodi Mourning on 01/21/18. Pt verbalized understanding and says she will respond via MyChart with a question about the weekly injection. She says she will wait to talk to endocrine. Unable to document in result note due to result note not routed to [facility].

## 2018-01-21 NOTE — Telephone Encounter (Signed)
Her CMP was normal except for glucose; the abnormalities she mentioned was 3 months ago- not sure what was going on with her when those labs were drawn.  The weekly injection is called Trulicity; it is similar to Victoza; I like it better because it is once a week/ less problems with nausea/ weight loss is a benefit as well.

## 2018-01-22 ENCOUNTER — Encounter: Payer: Self-pay | Admitting: Family

## 2018-01-22 NOTE — Telephone Encounter (Signed)
Please advise. Thanks.  

## 2018-01-23 ENCOUNTER — Telehealth: Payer: Self-pay | Admitting: Family

## 2018-01-23 ENCOUNTER — Other Ambulatory Visit: Payer: Self-pay | Admitting: Family

## 2018-01-23 MED ORDER — LIRAGLUTIDE 18 MG/3ML ~~LOC~~ SOPN: PEN_INJECTOR | SUBCUTANEOUS | 1 refills | Status: DC

## 2018-01-23 NOTE — Telephone Encounter (Signed)
Copied from [place] [id number]. Topic: Quick Communication - See Telephone Encounter >> Jan 23, 2018  4:14 PM Blase Mess A wrote: CRM for notification. See Telephone encounter for: 01/23/18.liraglutide Donna Bernard) 18 MG/3ML SOPN [[id number]]  Vinnie Level from [facility] called [phone] Written for 50ml -1 pen Come in boxes of 2 or 3 pens Need a new script for 2pens for 58ml

## 2018-01-24 ENCOUNTER — Telehealth: Payer: Self-pay | Admitting: Family

## 2018-01-24 NOTE — Telephone Encounter (Signed)
Please let her know that her insurance will not cover the Victoza. Let's try using a little more Lantus for now and see how she will do. How are her sugars responding on the 20 units?

## 2018-01-24 NOTE — Telephone Encounter (Signed)
Info sent to patient via mychart.

## 2018-02-06 ENCOUNTER — Telehealth: Payer: Self-pay

## 2018-02-06 NOTE — Telephone Encounter (Signed)
How are her sugars doing with the increased insulin? She was supposed to check with [place] last week. We can re-try Trulicity- I like it but I think she said it caused her headaches in the past.

## 2018-02-06 NOTE — Telephone Encounter (Signed)
Copied and pasted message from [place]:  Call Documentation   Para Skeans A at 02/06/2018 4:15 PM   Status: Signed    Sending to Leslie, [place] at 02/06/2018 4:07 PM   Status: Signed    Pharmacy called and said that insurance will cover Trulicity if you think she needs to try another one. Thanks

## 2018-02-06 NOTE — Telephone Encounter (Signed)
Pharmacy called and said that insurance will cover Trulicity if you think she needs to try another one. Thanks

## 2018-02-06 NOTE — Telephone Encounter (Signed)
Sending to CMA 

## 2018-02-06 NOTE — Telephone Encounter (Signed)
Sent patient message via mychart.

## 2018-02-07 NOTE — Telephone Encounter (Signed)
Emailed patient via Occupational psychologist

## 2018-02-07 NOTE — Telephone Encounter (Signed)
Sent patient message via mychart.

## 2018-02-07 NOTE — Telephone Encounter (Signed)
Emailed patient on yesterday.

## 2018-02-11 NOTE — Telephone Encounter (Signed)
Sent patient another message today via my-chart. Awaiting for her to respond.

## 2018-02-13 MED FILL — FLUCONAZOLE 150 MG TABS: 150 | 4 days supply | Qty: 2 | Fill #1

## 2018-02-13 MED FILL — METOPROLOL TARTRATE 25 MG T: 25 | 90 days supply | Qty: 180 | Fill #0

## 2018-02-18 ENCOUNTER — Other Ambulatory Visit: Payer: Self-pay | Admitting: Urgent Care

## 2018-02-18 ENCOUNTER — Encounter: Payer: Self-pay | Admitting: Family

## 2018-03-07 ENCOUNTER — Encounter: Payer: Self-pay | Admitting: Internal Medicine

## 2018-03-07 ENCOUNTER — Ambulatory Visit (INDEPENDENT_AMBULATORY_CARE_PROVIDER_SITE_OTHER): Payer: 59 | Admitting: Internal Medicine

## 2018-03-07 VITALS — BP 128/88 | HR 90 | Ht 64.0 in | Wt 196.2 lb

## 2018-03-07 DIAGNOSIS — E1165 Type 2 diabetes mellitus with hyperglycemia: Secondary | ICD-10-CM | POA: Diagnosis not present

## 2018-03-07 LAB — GLUCOSE, POCT (MANUAL RESULT ENTRY): POC Glucose: 253 mg/dl — AB (ref 70–99)

## 2018-03-07 MED ORDER — INSULIN LISPRO 100 UNIT/ML (KWIKPEN): 8.0000 [IU] | PEN_INJECTOR | Freq: Three times a day (TID) | SUBCUTANEOUS | 11 refills | Status: DC

## 2018-03-07 MED ORDER — INSULIN PEN NEEDLE 30G X 8 MM MISC: 3.0000 | Status: DC | PRN

## 2018-03-07 MED FILL — HUMALOG 100 UNITS/ML KWIKPE: 100 | 63 days supply | Qty: 15 | Fill #0

## 2018-03-07 NOTE — Progress Notes (Signed)
Name: Jeanette Meyer  MRN/ DOB: [id number], [birth year]   Age/ Sex: 42 y.o., female    PCP: Marrian Salvage, [place]   Reason for Endocrinology Evaluation: Type 2 Diabetes Mellitus  Date of Initial Endocrinology Visit: 03/09/2018     PATIENT IDENTIFIER: Jeanette Meyer is a 41 y.o. female with a past medical history of HTN and T2DM. The patient presented for initial ndocrinology clinic visit on 03/09/2018 for consultative assistance with her diabetes management.    HPI: Jeanette Meyer was diagnosed with Type 2 DM at age 60. She She developed a pruritic rash at the time further work up revealed DM. She was initially on MDI insulin regimen ~ months. With lifestyle changes and weight loss she was able to come off insulin. She was switched to oral glycemic agents at the time. Over the years she has tried Trulitcity which caused palpitations. Januvia caused joint pains. She has also tried [place] but due to a single episode of UTI patient states this was discontinued by PCP, Lantus was added in August, 2019 to her regimen.  Her hemoglobin A1c was 12.2% in August, 2019.   Currently checking blood sugars 2 x / day, before breakfast and during the day. She denies hypoglycemic episodes (CBG < 70 md/dL) .Patient has not required assistance for hypoglycemia. Patient has not required hospitalization within the last 1 year from hyper or hypoglycemia.     She works as a Chartered certified accountant at [employer], she works night shifts. Eats breakfast at 7:30 AM, Snack at 1630 and supper at 2230  In terms of diet, the patient admits to poor diet. She eats mainly 2 meals a day and snacks the rest of the day. She drinks sugar-sweetened beverages such as soda.  She lives with fiance'    HOME DIABETES REGIMEN: Basal: Lantus 24 units daily  Metformin XR 750 mg BID    Statin: No  ACE-I/ARB: Yes Prior Diabetic Education: 2017   METER DOWNLOAD SUMMARY: Did not bring meter Fasting usually in 180's. Yesterday was 138  mg/dL.     DIABETIC COMPLICATIONS: Microvascular complications:    Denies: Neuropathy, nephropathy , retinopathy   Last eye exam: Completed in 2018   Macrovascular complications:    Denies: CAD, CVA, PVD   PAST HISTORY: Past Medical History:  Past Medical History:  Diagnosis Date  . Diabetes mellitus without complication ([place])   . Hypertension    Past Surgical History:  Past Surgical History:  Procedure Laterality Date  . MYOMECTOMY    . RECTAL POLYPECTOMY        Social History:  reports that she has never smoked. She has never used smokeless tobacco. She reports that she drank alcohol. She reports that she does not use drugs. Family History:  Family History  Problem Relation Age of Onset  . Diabetes Mother   . Hypertension Mother   . Diabetes Maternal Grandmother   . Cancer Maternal Grandmother   . Hypertension Maternal Grandmother      HOME MEDICATIONS: Allergies as of 03/07/2018      Reactions   Bactrim [sulfamethoxazole-trimethoprim] Diarrhea, Nausea And Vomiting   Clarithromycin Anaphylaxis   Doxycycline Anaphylaxis   Latex       Medication List        Accurate as of 03/07/18 11:59 PM. Always use your most recent med list.          amLODipine 5 MG tablet Commonly known as:  NORVASC Take 1 tablet (5 mg total) by mouth  daily.   Blood Glucose Monitor System w/Device Kit Dispense Freestyle meter or per insurance coverage. Check glucose once daily - ICD 10- E11.65   cetirizine 10 MG tablet Commonly known as:  ZYRTEC Take 10 mg by mouth daily.   fluconazole 150 MG tablet Commonly known as:  DIFLUCAN Take 1 tablet as directed; repeat after 72 hours as directed   freestyle lancets Use as instructed   glucose blood test strip Check fasting blood sugar once daily.   Insulin Glargine 100 UNIT/ML Solostar Pen Commonly known as:  LANTUS Inject 20 units insulin at night as directed   insulin lispro 100 UNIT/ML KiwkPen Commonly known as:   HUMALOG Inject 0.08 mLs (8 Units total) into the skin 3 (three) times daily.   lisinopril 20 MG tablet Commonly known as:  PRINIVIL,ZESTRIL Take 1 tablet (20 mg total) by mouth 2 (two) times daily.   metFORMIN 750 MG 24 hr tablet Commonly known as:  GLUCOPHAGE-XR Take 1 tablet (750 mg total) by mouth 2 (two) times daily.   metoprolol tartrate 25 MG tablet Commonly known as:  LOPRESSOR Take 1 tablet (25 mg total) by mouth 2 (two) times daily.        ALLERGIES: Allergies  Allergen Reactions  . Bactrim [Sulfamethoxazole-Trimethoprim] Diarrhea and Nausea And Vomiting  . Clarithromycin Anaphylaxis  . Doxycycline Anaphylaxis  . Latex      REVIEW OF SYSTEMS: A comprehensive ROS was conducted with the patient and is negative except as per HPI and below:  Review of Systems  Constitutional: Negative.   HENT: Negative.   Eyes: Negative.   Respiratory: Negative.   Cardiovascular: Negative.   Gastrointestinal: Negative.   Genitourinary: Negative.   Skin: Negative.   Neurological: Negative.   Endo/Heme/Allergies: Negative.       OBJECTIVE:   VITAL SIGNS: BP 128/88 (BP Location: Left Arm, Patient Position: Sitting, Cuff Size: Normal)   Pulse 90   Ht _0  (1.626 m)   Wt 89 kg   SpO2 97%   BMI 33.68 kg/m    PHYSICAL EXAM:  General: Pt appears well and is in NAD  Hydration: Well-hydrated with moist mucous membranes and good skin turgor  HEENT: Head: Unremarkable with good dentition. Oropharynx clear without exudate.  Eyes: External eye exam normal without stare, lid lag or exophthalmos.  EOM intact.  PERRL.  Neck: General: Supple without adenopathy or carotid bruits. Thyroid: Thyroid size normal.  No goiter or nodules appreciated. No thyroid bruit.  Lungs: Clear with good BS bilat with no rales, rhonchi, or wheezes  Heart: RRR with normal S1 and S2 and no gallops; no murmurs; no rub  Abdomen: Normoactive bowel sounds, soft, nontender, without masses or organomegaly  palpable  Extremities:  Lower extremities - No pretibial edema. No lesions.  Skin: Normal texture and temperature to palpation. No rash noted.  Neuro: MS is good with appropriate affect, pt is alert and Ox3     DM foot exam: 03/07/2018 The skin of the feet is intact without sores or ulcerations. The pedal pulses are 2+ on right and 2+ on left. The sensation is intact to a screening 5.07, 10 gram monofilament bilaterally   DATA REVIEWED: A1c:  Results for ARYAH, DOERING (MRN [id number]) as of 03/07/2018 08:33  Ref. Range 01/17/2018 11:05  Hemoglobin A1C Latest Ref Range: <5.7 % of total Hgb 12.2 (H)    Results for SYRINA, WAKE (MRN [id number]) as of 03/07/2018 08:33  Ref. Range 01/17/2018 09:58  Sodium Latest Ref  Range: 135 - 145 mEq/L 135  Potassium Latest Ref Range: 3.5 - 5.1 mEq/L 3.9  Chloride Latest Ref Range: 96 - 112 mEq/L 99  CO2 Latest Ref Range: 19 - 32 mEq/L 30  Glucose Latest Ref Range: 70 - 99 mg/dL 249 (H)  BUN Latest Ref Range: 6 - 23 mg/dL 9  Creatinine Latest Ref Range: 0.40 - 1.20 mg/dL 0.68  Calcium Latest Ref Range: 8.4 - 10.5 mg/dL 9.5  Alkaline Phosphatase Latest Ref Range: 39 - 117 U/L 89  Albumin Latest Ref Range: 3.5 - 5.2 g/dL 4.0  AST Latest Ref Range: 0 - 37 U/L 14  ALT Latest Ref Range: 0 - 35 U/L 12  Total Protein Latest Ref Range: 6.0 - 8.3 g/dL 7.7  Total Bilirubin Latest Ref Range: 0.2 - 1.2 mg/dL 0.4  GFR Latest Ref Range: >60.00 mL/min 121.67   Results for ELYSA, WOMAC (MRN [id number]) as of 03/07/2018 08:33  Ref. Range 10/03/2017 12:00  Microalbumin, Urine Latest Ref Range: Not Estab. ug/mL <3.0  MICROALB/CREAT RATIO Latest Ref Range: 0.0 - 30.0 mg/g creat <7.9  Creatinine, Urine Latest Ref Range: Not Estab. mg/dL 38.0    ASSESSMENT / PLAN / RECOMMENDATIONS:   1) Type 2 Diabetes Mellitus, poorly controlled, without complications - Most recent A1c of 12.2% %. Goal A1c < 7.0%.    Plan: GENERAL:  Poorly controlled diabetes due  to sub-optimal medical management and dietary indiscretion.   Counseled patient about eating 3 consistent meals and avoiding snacks.   Discussed pharmacokinetics of insulin (prandial/basal)  Discussed adding prandial for better control on glucose at this time. We could explore SGLT-2 inhibitrs or another GLP-1 agonist other than Trulicity in the future.   Patient expressed understanding  And is ain agreement of the above plan.   Patient states that the lowest BG during fasting status was 75 mg/dL. Hence will decrease basal insulin as below   MEDICATIONS:  Decrease Lantus to 22 units  Start Humalog bases on meal                                   Take 4 units for small meal                                           6 units for medium meal                                           8 units for large  Meal  Continue Metformin XR 750 mg BID   EDUCATION / INSTRUCTIONS:  BG monitoring instructions: Patient is instructed to check her blood sugars  3-4 times a day, before meals and at bedtime  Call [facility] if: BG persistently < 70 or > 300. . I reviewed the Rule of 15 for the treatment of hypoglycemia in detail with the patient. Literature supplied.   2) Diabetic complications:   Eye: Does not have known diabetic retinopathy. Last eye exam was Greater Than 1 year ago.   Neuro/ Feet: does not  have known diabetic peripheral neuropathy .  Renal: Patient does not have known baseline CKD.  She is on an ACEI/ARB at present.   3) Lipids:  Patient is not on a statin. Will defer to PCP. Per ADA, patient should be on a statin due to age and DM.    4) Hypertension: she is at goal of < 140/90 mmHg.      Signed electronically by: Mack Guise, MD  [facility]  [facility] [address], [place], [place] [address] Phone: [phone] FAX: [phone]   CC: Marrian Salvage, [place]  [place] [address] Phone: [phone]  Fax: [phone]    Return to Endocrinology clinic as below: Future Appointments  Date Time Provider [place]  03/28/2018  8:30 AM Shammleffer, Melanie Crazier, MD [facility] None

## 2018-03-07 NOTE — Patient Instructions (Addendum)
-   Decrease Lantus to 22 units daily - Start Humalog with meals as below       Small meal take 4 units       Medium meal take 6 units        Large meal take 8 units   - Make sure you are taking Humalog before meals or with meals but NOT after the meals - Check your sugar Before you eat and at bedtime    - HOW TO TREAT LOW BLOOD SUGARS (Blood sugar LESS THAN 70 MG/DL)  Please follow the RULE OF 15 for the treatment of hypoglycemia treatment (when your (blood sugars are less than 70 mg/dL)    STEP 1: Take 15 grams of carbohydrates when your blood sugar is low, which includes:   3-4 GLUCOSE TABS  OR  3-4 OZ OF JUICE OR REGULAR SODA OR  ONE TUBE OF GLUCOSE GEL     STEP 2: RECHECK blood sugar in 15 MINUTES STEP 3: If your blood sugar is still low at the 15 minute recheck --> then, go back to STEP 1 and treat AGAIN with another 15 grams of carbohydrates.

## 2018-03-09 MED ORDER — GLUCOSE BLOOD VI STRP: ORAL_STRIP | 12 refills | Status: AC

## 2018-03-10 MED FILL — FREESTYLE LITE TEST STRIP: 90 days supply | Qty: 100 | Fill #0

## 2018-03-18 MED FILL — LANTUS SOLOSTAR 100 UNITS/M: 100 | 30 days supply | Qty: 6 | Fill #1

## 2018-03-18 MED FILL — METFORMIN HCL ER 750 MG TAB: 750 | 90 days supply | Qty: 90 | Fill #2

## 2018-03-21 ENCOUNTER — Telehealth: Payer: Self-pay | Admitting: Internal Medicine

## 2018-03-21 NOTE — Telephone Encounter (Signed)
Up dated pt's foot exam as Dr. Maretta Bees did do this during visit. Spoke with Dr. Maretta Bees in office and verbally she stated pt should reach out to her pcp for the refill of her diflucan. Called pt and informed her that I have updated her chart and to reach out to her pcp for the refill. Pt verbalized understanding and had no additional questions at this time. Nothing further is needed.

## 2018-03-21 NOTE — Telephone Encounter (Signed)
Patient called stating Dr. Kelton Pillar gave her a foot exam and would like that to be taken off her To Do's in [place]. Also she would like her Diflucan refilled. Please Advise. [place]

## 2018-03-28 ENCOUNTER — Ambulatory Visit (INDEPENDENT_AMBULATORY_CARE_PROVIDER_SITE_OTHER): Payer: 59 | Admitting: Internal Medicine

## 2018-03-28 ENCOUNTER — Encounter: Payer: Self-pay | Admitting: Internal Medicine

## 2018-03-28 VITALS — BP 158/90 | HR 81 | Resp 16 | Ht 64.0 in | Wt 203.0 lb

## 2018-03-28 DIAGNOSIS — E1165 Type 2 diabetes mellitus with hyperglycemia: Secondary | ICD-10-CM

## 2018-03-28 DIAGNOSIS — I10 Essential (primary) hypertension: Secondary | ICD-10-CM | POA: Diagnosis not present

## 2018-03-28 MED ORDER — FREESTYLE LIBRE 14 DAY SENSOR MISC: 2.0000 "application " | Freq: Four times a day (QID) | 11 refills | Status: DC

## 2018-03-28 MED ORDER — AMLODIPINE BESYLATE 5 MG PO TABS: 5.0000 mg | ORAL_TABLET | Freq: Every day | ORAL | 0 refills | Status: DC

## 2018-03-28 MED ORDER — FREESTYLE LIBRE 14 DAY READER DEVI: 1.0000 "application " | 0 refills | Status: DC

## 2018-03-28 MED FILL — AMLODIPINE BESYLATE 5 MG TA: 5 | 30 days supply | Qty: 30 | Fill #0

## 2018-03-28 NOTE — Progress Notes (Signed)
Name: Jeanette Meyer  Age/ Sex: 42 y.o., female   MRN/ DOB: [id number], 10/07/1975     PCP: Marrian Salvage, [place]   Reason for Endocrinology Evaluation: Type 2 Diabetes Mellitus  Initial Endocrine Consultative Visit: 03/07/18    PATIENT IDENTIFIER: Jeanette Meyer is a 42 y.o. female with a past medical history of HTN and DM. The patient has followed with Endocrinology clinic since 03/07/18 for consultative assistance with management of her diabetes.  DIABETIC HISTORY:  [name] was diagnosed with T2 DM at age 45. She was initially on MDI insulin regimen ~ months. With lifestyle changes and weight loss she was able to come off insulin. She was switched to oral glycemic agents at the time. Over the years she has tried Trulitcity which caused palpitations. Januvia caused joint pains. She has also tried [place] but due to a single episode of UTI patient states this was discontinued by PCP, Lantus was added in August, 2019 to her regimen . Her hemoglobin A1c was 12.2% on her presentation to our clinic    SUBJECTIVE:   During the last visit (03/07/2018): Her A1c was 12.2% . Her lantus dose was reduced and she was started on prandial insulin and continued on Metformin .  Today (03/28/2018): Ms. [name] is here for a 3 weeks follow up on her diabetes management.   She checks her blood sugars 2-3 times daily. The patient has not had hypoglycemic episodes since the last clinic visit, but she did feel "different" with a glucose of 99 mg/dL. Otherwise, the patient has not required any recent emergency interventions for hypoglycemia and has not had recent hospitalizations secondary to hyper or hypoglycemic episodes.   She has ran out of her Amlodipine a month ago.    She has two meters that she uses (accu check and Libreview) one for home and one for work   She continues to snack and eat high carb meals.     ROS: As per HPI and as detailed below: Review of Systems  Constitutional: Negative for weight loss.  HENT: Positive for congestion. Negative for sore throat.   Respiratory: Negative for cough and shortness of breath.   Cardiovascular: Positive for leg swelling. Negative for chest pain.  Genitourinary: Negative for frequency.  Skin: Negative for itching and rash.  Endo/Heme/Allergies: Negative for polydipsia.      HOME DIABETES REGIMEN:  Lantus 22 units daily Metformin 750 mg BID Humalog mostly has been taking 6-8 units    METER DOWNLOAD SUMMARY: Date range evaluated: 10/10- 03/28/18   Accu-check Fingerstick Blood Glucose Tests = 8 Average Number Tests/Day = 0.3 Overall Mean FS Glucose = 158 Standard Deviation = 51  BG Ranges: Low = 99 High = 240   Hypoglycemic Events/30 Days: BG < 50 = 0 Episodes of symptomatic severe hypoglycemia = 0  METER DOWNLOAD SUMMARY: Date range evaluated: 10/26-11/8  Libreview Fingerstick Blood Glucose Tests = 14 Average Number Tests/Day = 0.6 Overall Mean FS Glucose = 219 Standard Deviation = 74.4  BG Ranges: Low = 105 High = 334   HISTORY:  Past Medical History:  Past Medical History:  Diagnosis Date  . Diabetes mellitus without complication ([place])   . Hypertension    Past Surgical History:  Past Surgical History:  Procedure Laterality Date  . MYOMECTOMY    . RECTAL POLYPECTOMY     Social History:  reports that she has never smoked. She has never used smokeless tobacco. She reports that she drank alcohol.  She reports that she does not use drugs. Family History:  Family History  Problem Relation Age of Onset  . Diabetes Mother   . Hypertension Mother   . Diabetes Maternal Grandmother   . Cancer Maternal Grandmother   . Hypertension Maternal Grandmother      HOME MEDICATIONS: Allergies as of 03/28/2018      Reactions   Bactrim [sulfamethoxazole-trimethoprim] Diarrhea, Nausea And Vomiting   Clarithromycin  Anaphylaxis   Doxycycline Anaphylaxis   Latex       Medication List        Accurate as of 03/28/18  8:55 AM. Always use your most recent med list.          amLODipine 5 MG tablet Commonly known as:  NORVASC Take 1 tablet (5 mg total) by mouth daily.   Blood Glucose Monitor System w/Device Kit Dispense Freestyle meter or per insurance coverage. Check glucose once daily - ICD 10- E11.65   cetirizine 10 MG tablet Commonly known as:  ZYRTEC Take 10 mg by mouth daily.   fluconazole 150 MG tablet Commonly known as:  DIFLUCAN Take 1 tablet as directed; repeat after 72 hours as directed   freestyle lancets Use as instructed   glucose blood test strip Check fasting blood sugar once daily.   Insulin Glargine 100 UNIT/ML Solostar Pen Commonly known as:  LANTUS Inject 20 units insulin at night as directed   insulin lispro 100 UNIT/ML KiwkPen Commonly known as:  HUMALOG Inject 0.08 mLs (8 Units total) into the skin 3 (three) times daily.   lisinopril 20 MG tablet Commonly known as:  PRINIVIL,ZESTRIL Take 1 tablet (20 mg total) by mouth 2 (two) times daily.   metFORMIN 750 MG 24 hr tablet Commonly known as:  GLUCOPHAGE-XR Take 1 tablet (750 mg total) by mouth 2 (two) times daily.   metoprolol tartrate 25 MG tablet Commonly known as:  LOPRESSOR Take 1 tablet (25 mg total) by mouth 2 (two) times daily.        OBJECTIVE:   Vital Signs: BP (!) 158/90 (BP Location: Right Arm, Patient Position: Sitting, Cuff Size: Normal)   Pulse 81   Resp 16   Ht _0  (1.626 m)   Wt 203 lb (92.1 kg)   SpO2 98%   BMI 34.84 kg/m   Wt Readings from Last 3 Encounters:  03/28/18 203 lb (92.1 kg)  03/07/18 196 lb 3.2 oz (89 kg)  01/17/18 201 lb 1.3 oz (91.2 kg)     Exam: General: Pt appears well and is in NAD  Neck: General: Supple without adenopathy. Thyroid: Thyroid size normal.  No goiter or nodules appreciated. No thyroid bruit.  Lungs: Clear with good BS bilat with no rales,  rhonchi, or wheezes  Heart: RRR with normal S1 and S2 and no gallops; no murmurs; no rub  Abdomen: Normoactive bowel sounds, soft, nontender, without masses or organomegaly palpable  Extremities: No pretibial edema. No tremor.   Skin: Normal texture and temperature to palpation. No rash noted.mild Acanthosis nigricans around the neck  Neuro: MS is good with appropriate affect, pt is alert and Ox3   DATA REVIEWED:  Lab Results  Component Value Date   HGBA1C 12.2 (H) 01/17/2018   HGBA1C 12.0 (H) 10/03/2017   Lab Results  Component Value Date   CREATININE 0.68 01/17/2018     ASSESSMENT / PLAN / RECOMMENDATIONS:   1) Type 2 Diabetes Mellitus, poorly controlled, without complications - Most recent A1c of 12.2% %. Goal A1c <  7.0%.     Plan:  - Patient had an incident where she felt different with a glucose of 99 mg/dL, she woke up because someone nocked on her door not because of symptoms, she ate a grape fruit, a few hours later, glucose was 134 mg/dL - We discussed if the patient is symptomatic  And BG > 70 mg/dL she could take 1-2 sips of sugar sweetened- beverage.  - I offered to reduce her lantus if she has fear of hypoglycemia, but she declined stating, she is ok with these readings.  - In review of her glucose readings, she continues to have hyperglycemia with BG 's > 150 mg/dL for the most part.  - We discussed avoiding sugar-sweetend beverages and avoiding snacks. We again reviewed pharmacokinetics of basal/bolus insulin and that eating with no insulin coverage will cause hyperglycemia.     MEDICATIONS: Continue Lantus at 22 units daily Humalog 6 units with small meal Humalog 8 units with medium meal Humalog 10 units with large meal   EDUCATION / INSTRUCTIONS: BG monitoring instructions: Patient is instructed to check her blood sugars 4 times a day, before meals and at bedtime. Call [facility] if: BG persistently < 70 or > 300. I reviewed the Rule of  15 for the treatment of hypoglycemia in detail with the patient. Literature supplied.     2) Hypertension: She is not  at goal of < 140/90 mmHg. Will refill Amlodipine for #30. She was advised to contact PCP for refills and proper BP management.    F/U in 2 months    Signed electronically by: Mack Guise, MD  [facility]  [facility] [address] [place], [place] [address] Phone: [phone] FAX: [phone]   CC: Marrian Salvage, [place] [address] Phone: [phone]  Fax: [phone]  Return to Endocrinology clinic as below: No future appointments.

## 2018-03-28 NOTE — Patient Instructions (Addendum)
-   Lantus to 22 units daily - Humalog with meals as below       Small meal take 6 units       Medium meal take 8 units        Large meal take 10 units

## 2018-03-31 ENCOUNTER — Ambulatory Visit: Payer: 59 | Admitting: Family

## 2018-04-02 ENCOUNTER — Encounter: Payer: Self-pay | Admitting: Family

## 2018-04-02 ENCOUNTER — Ambulatory Visit (INDEPENDENT_AMBULATORY_CARE_PROVIDER_SITE_OTHER): Payer: 59 | Admitting: Family

## 2018-04-02 VITALS — BP 138/80 | HR 92 | Temp 98.6°F | Ht 64.0 in | Wt 204.1 lb

## 2018-04-02 DIAGNOSIS — G43829 Menstrual migraine, not intractable, without status migrainosus: Secondary | ICD-10-CM

## 2018-04-02 DIAGNOSIS — I10 Essential (primary) hypertension: Secondary | ICD-10-CM | POA: Diagnosis not present

## 2018-04-02 MED ORDER — KETOROLAC TROMETHAMINE 30 MG/ML IJ SOLN
30.0000 mg | Freq: Once | INTRAMUSCULAR | Status: AC
  Administered 2018-04-02: 30 mg via INTRAMUSCULAR

## 2018-04-02 MED ORDER — METOPROLOL TARTRATE 25 MG PO TABS: 25.0000 mg | ORAL_TABLET | Freq: Two times a day (BID) | ORAL | 1 refills | Status: DC

## 2018-04-02 MED ORDER — AMLODIPINE BESYLATE 5 MG PO TABS: 5.0000 mg | ORAL_TABLET | Freq: Every day | ORAL | 1 refills | Status: DC

## 2018-04-02 MED ORDER — LISINOPRIL 20 MG PO TABS: 20.0000 mg | ORAL_TABLET | Freq: Two times a day (BID) | ORAL | 1 refills | Status: DC

## 2018-04-02 MED FILL — LISINOPRIL 20 MG TABLET: 20 | 90 days supply | Qty: 180 | Fill #0

## 2018-04-02 NOTE — Progress Notes (Signed)
Jeanette Meyer is a 42 y.o. female with the following history as recorded in [employer]:  Patient Active Problem List   Diagnosis Date Noted  . Essential hypertension 01/17/2018  . Uncontrolled type 2 diabetes mellitus ([place]) 01/17/2018  . H/O rectal polypectomy 01/17/2018  . Fibroid uterus 11/06/2017  . Abnormal uterine bleeding (AUB) 11/06/2017  . Abscess of groin, left 11/06/2017    Current Outpatient Medications  Medication Sig Dispense Refill  . amLODipine (NORVASC) 5 MG tablet Take 1 tablet (5 mg total) by mouth daily. 90 tablet 1  . Blood Glucose Monitoring Suppl (BLOOD GLUCOSE MONITOR SYSTEM) w/Device KIT Dispense Freestyle meter or per insurance coverage. Check glucose once daily - ICD 10- E11.65 1 each 0  . cetirizine (ZYRTEC) 10 MG tablet Take 10 mg by mouth daily.    . Continuous Blood Gluc Receiver (FREESTYLE LIBRE 14 DAY READER) DEVI 1 application by Does not apply route 4 (four) times a week. 1 Device 0  . glucose blood test strip Check fasting blood sugar once daily. 150 each 12  . Insulin Glargine (LANTUS SOLOSTAR) 100 UNIT/ML Solostar Pen Inject 20 units insulin at night as directed 5 pen 5  . insulin lispro (HUMALOG KWIKPEN) 100 UNIT/ML KiwkPen Inject 0.08 mLs (8 Units total) into the skin 3 (three) times daily. 15 mL 11  . Lancets (FREESTYLE) lancets Use as instructed 100 each 12  . lisinopril (PRINIVIL,ZESTRIL) 20 MG tablet Take 1 tablet (20 mg total) by mouth 2 (two) times daily. 180 tablet 1  . metFORMIN (GLUCOPHAGE-XR) 750 MG 24 hr tablet Take 1 tablet (750 mg total) by mouth 2 (two) times daily. 180 tablet 1  . metoprolol tartrate (LOPRESSOR) 25 MG tablet Take 1 tablet (25 mg total) by mouth 2 (two) times daily. 180 tablet 1  . Continuous Blood Gluc Sensor (FREESTYLE LIBRE 14 DAY SENSOR) MISC 2 application by Does not apply route QID. (Patient not taking: Reported on 04/02/2018) 2 each 11   Current Facility-Administered Medications  Medication Dose Route Frequency  Provider Last Rate Last Dose  . Insulin Pen Needle (NOVOFINE) 30 each  3 packet Subcutaneous PRN Shamleffer, Melanie Crazier, MD      . ketorolac (TORADOL) 30 MG/ML injection 30 mg  30 mg Intramuscular Once Marrian Salvage, FNP        Allergies: Bactrim [sulfamethoxazole-trimethoprim]; Clarithromycin; Doxycycline; and Latex  Past Medical History:  Diagnosis Date  . Diabetes mellitus without complication ([place])   . Hypertension     Past Surgical History:  Procedure Laterality Date  . MYOMECTOMY    . RECTAL POLYPECTOMY      Family History  Problem Relation Age of Onset  . Diabetes Mother   . Hypertension Mother   . Diabetes Maternal Grandmother   . Cancer Maternal Grandmother   . Hypertension Maternal Grandmother     Social History   Tobacco Use  . Smoking status: Never Smoker  . Smokeless tobacco: Never Used  Substance Use Topics  . Alcohol use: Not Currently    Subjective:  Patient presents with concerns for recent problems with swelling of her legs; noticed last week; had not been taking all 3 of her medications- blood pressure was not well controlled; Denies any chest pain, shortness of breath, blurred vision or headache. Has been doing better since taking all 3 of her medications; has been on Amlodipine for at least 6 months- never had swelling prior;  Also having migraine today; has tried Aleve D with no relief; wonders about  getting a medication to treat the pain;  Working with endocrine for follow-up of Type 2 Diabetes;     Objective:  Vitals:   04/02/18 0823  BP: 138/80  Pulse: 92  Temp: 98.6 F (37 C)  TempSrc: Oral  SpO2: 99%  Weight: 204 lb 1.3 oz (92.6 kg)  Height: _0  (1.626 m)    General: Well developed, well nourished, in no acute distress  Skin : Warm and dry.  Head: Normocephalic and atraumatic  Eyes: Sclera and conjunctiva clear; pupils round and reactive to light; extraocular movements intact  Ears: External normal; canals clear;  tympanic membranes normal  Oropharynx: Pink, supple. No suspicious lesions  Neck: Supple without thyromegaly, adenopathy  Lungs: Respirations unlabored; clear to auscultation bilaterally without wheeze, rales, rhonchi  CVS exam: normal rate and regular rhythm.  Abdomen: Soft; nontender; nondistended; normoactive bowel sounds; no masses or hepatosplenomegaly  Musculoskeletal: No deformities; no active joint inflammation  Extremities: No pedal edema noted today, cyanosis, clubbing  Vessels: Symmetric bilaterally  Neurologic: Alert and oriented; speech intact; face symmetrical; moves all extremities well; CNII-XII intact without focal deficit   Assessment:  1. Essential hypertension   2. Menstrual migraine without status migrainosus, not intractable     Plan:  1. Stable; no swelling noted today; suspect recent symptoms due to blood pressure not being controlled; follow-up in 6 months, sooner prn. 2. Toradol IM 30 mg given today;   Return in about 6 months (around 10/01/2018).  No orders of the defined types were placed in this encounter.   Requested Prescriptions   Signed Prescriptions Disp Refills  . amLODipine (NORVASC) 5 MG tablet 90 tablet 1    Sig: Take 1 tablet (5 mg total) by mouth daily.  Marland Kitchen lisinopril (PRINIVIL,ZESTRIL) 20 MG tablet 180 tablet 1    Sig: Take 1 tablet (20 mg total) by mouth 2 (two) times daily.  . metoprolol tartrate (LOPRESSOR) 25 MG tablet 180 tablet 1    Sig: Take 1 tablet (25 mg total) by mouth 2 (two) times daily.

## 2018-04-23 ENCOUNTER — Encounter: Payer: Self-pay | Admitting: Family

## 2018-04-28 ENCOUNTER — Encounter: Payer: Self-pay | Admitting: Family

## 2018-04-29 ENCOUNTER — Ambulatory Visit (INDEPENDENT_AMBULATORY_CARE_PROVIDER_SITE_OTHER): Payer: 59 | Admitting: Internal Medicine

## 2018-04-29 ENCOUNTER — Encounter: Payer: Self-pay | Admitting: Internal Medicine

## 2018-04-29 DIAGNOSIS — E1165 Type 2 diabetes mellitus with hyperglycemia: Secondary | ICD-10-CM

## 2018-04-29 DIAGNOSIS — L02219 Cutaneous abscess of trunk, unspecified: Secondary | ICD-10-CM

## 2018-04-29 MED ORDER — CEPHALEXIN 500 MG PO CAPS: 500.0000 mg | ORAL_CAPSULE | Freq: Four times a day (QID) | ORAL | 0 refills | Status: DC

## 2018-04-29 MED ORDER — MUPIROCIN 2 % EX OINT: TOPICAL_OINTMENT | CUTANEOUS | 0 refills | Status: DC

## 2018-04-29 MED ORDER — ACETAMINOPHEN-CODEINE 300-30 MG PO TABS: 1.0000 | ORAL_TABLET | Freq: Four times a day (QID) | ORAL | 0 refills | Status: DC | PRN

## 2018-04-29 MED FILL — ACETAMINOPHEN/COD #3 TABLET: 300-30 | 3 days supply | Qty: 20 | Fill #0

## 2018-04-29 MED FILL — MUPIROCIN 2% OINTMENT: 2 | 7 days supply | Qty: 22 | Fill #0

## 2018-04-29 MED FILL — CEPHALEXIN 500 MG CAPSULE: 500 | 7 days supply | Qty: 28 | Fill #0

## 2018-04-29 NOTE — Assessment & Plan Note (Addendum)
Improve CBG control. F/u w/Laura

## 2018-04-29 NOTE — Assessment & Plan Note (Addendum)
L pubic area See procedure Keflex po T#3 Bactroban

## 2018-04-29 NOTE — Patient Instructions (Signed)
Wound instructions : change dressing once a day or twice a day is needed. Change dressing after  shower in the morning.  Pat dry the wound with gauze. Pull out one or two inch of packing everyday and cut it off. Re-dress wound with antibiotic ointment and Telfa pad or a Band-Aid of appropriate size.   Please contact us if you notice a recollection of pus in the abscess fever and chills increased pain redness red streaks near the abscess increased swelling in the area.

## 2018-04-29 NOTE — Progress Notes (Signed)
Subjective:  Patient ID: Jeanette Meyer, female    DOB: 1975-07-07  Age: 42 y.o. MRN: [id number]  CC: No chief complaint on file.   HPI ZANIAH TITTERINGTON presents for L pubic abscess x days; pain - it as opened up this morning. DM: CBG 180 this morning  Outpatient Medications Prior to Visit  Medication Sig Dispense Refill  . amLODipine (NORVASC) 5 MG tablet Take 1 tablet (5 mg total) by mouth daily. 90 tablet 1  . Blood Glucose Monitoring Suppl (BLOOD GLUCOSE MONITOR SYSTEM) w/Device KIT Dispense Freestyle meter or per insurance coverage. Check glucose once daily - ICD 10- E11.65 1 each 0  . cetirizine (ZYRTEC) 10 MG tablet Take 10 mg by mouth daily.    . Continuous Blood Gluc Receiver (FREESTYLE LIBRE 14 DAY READER) DEVI 1 application by Does not apply route 4 (four) times a week. 1 Device 0  . glucose blood test strip Check fasting blood sugar once daily. 150 each 12  . Insulin Glargine (LANTUS SOLOSTAR) 100 UNIT/ML Solostar Pen Inject 20 units insulin at night as directed 5 pen 5  . insulin lispro (HUMALOG KWIKPEN) 100 UNIT/ML KiwkPen Inject 0.08 mLs (8 Units total) into the skin 3 (three) times daily. 15 mL 11  . Lancets (FREESTYLE) lancets Use as instructed 100 each 12  . lisinopril (PRINIVIL,ZESTRIL) 20 MG tablet Take 1 tablet (20 mg total) by mouth 2 (two) times daily. 180 tablet 1  . metFORMIN (GLUCOPHAGE-XR) 750 MG 24 hr tablet Take 1 tablet (750 mg total) by mouth 2 (two) times daily. 180 tablet 1  . metoprolol tartrate (LOPRESSOR) 25 MG tablet Take 1 tablet (25 mg total) by mouth 2 (two) times daily. 180 tablet 1  . Continuous Blood Gluc Sensor (FREESTYLE LIBRE 14 DAY SENSOR) MISC 2 application by Does not apply route QID. (Patient not taking: Reported on 04/02/2018) 2 each 11   Facility-Administered Medications Prior to Visit  Medication Dose Route Frequency Provider Last Rate Last Dose  . Insulin Pen Needle (NOVOFINE) 30 each  3 packet Subcutaneous PRN Shamleffer, Melanie Crazier, MD        ROS: Review of Systems  Constitutional: Negative for activity change, appetite change, chills, fatigue and unexpected weight change.  HENT: Negative for congestion, mouth sores and sinus pressure.   Eyes: Negative for visual disturbance.  Respiratory: Negative for cough and chest tightness.   Gastrointestinal: Negative for abdominal pain and nausea.  Genitourinary: Negative for difficulty urinating, frequency and vaginal pain.  Musculoskeletal: Negative for back pain and gait problem.  Skin: Positive for wound. Negative for pallor and rash.  Neurological: Negative for dizziness, tremors, weakness, numbness and headaches.  Psychiatric/Behavioral: Negative for confusion and sleep disturbance.    Objective:  BP 130/76 (BP Location: Left Arm, Patient Position: Sitting, Cuff Size: Large)   Pulse 89   Temp 98.4 F (36.9 C) (Oral)   Ht '5\' 4"'$  (1.626 m)   Wt 204 lb (92.5 kg)   SpO2 99%   BMI 35.02 kg/m   BP Readings from Last 3 Encounters:  04/29/18 130/76  04/02/18 138/80  03/28/18 (!) 158/90    Wt Readings from Last 3 Encounters:  04/29/18 204 lb (92.5 kg)  04/02/18 204 lb 1.3 oz (92.6 kg)  03/28/18 203 lb (92.1 kg)    Physical Exam  Constitutional: She appears well-developed. No distress.  HENT:  Head: Normocephalic.  Right Ear: External ear normal.  Left Ear: External ear normal.  Nose: Nose normal.  Mouth/Throat:  Oropharynx is clear and moist.  Eyes: Pupils are equal, round, and reactive to light. Conjunctivae are normal. Right eye exhibits no discharge. Left eye exhibits no discharge.  Neck: Normal range of motion. Neck supple. No JVD present. No tracheal deviation present. No thyromegaly present.  Cardiovascular: Normal rate, regular rhythm and normal heart sounds.  Pulmonary/Chest: No stridor. No respiratory distress. She has no wheezes.  Abdominal: Soft. Bowel sounds are normal. She exhibits no distension and no mass. There is no tenderness.  There is no rebound and no guarding.  Musculoskeletal: She exhibits no edema or tenderness.  Lymphadenopathy:    She has no cervical adenopathy.  Neurological: She displays normal reflexes. No cranial nerve deficit. She exhibits normal muscle tone. Coordination normal.  Skin: No rash noted. There is erythema.  Psychiatric: She has a normal mood and affect. Her behavior is normal. Judgment and thought content normal.  3.1x1.5 cm Lat L pubic area infiltrate w/a 2 mm opening   Procedure note:  Incision and Drainage of an Abscess   Indication : a localized collection of pus that is tender and not spontaneously resolving.    Risks including unsuccessful procedure , possible need for a repeat procedure due to pus accumulation, scar formation, and others as well as benefits were explained to the patient in detail. Written consent was obtained/signed.    The patient was placed in a decubitus position. The area of an abscess was prepped with povidone-iodine and draped in a sterile fashion. Local anesthesia with   2  cc of 2% lidocaine and epinephrine  was administered.  1 cm incision with #11strait blade was made. About 3 cc of purulent material was expressed. The abscess cavity was explored with a sterile hemostat and the walled- off pockets and septae were broken down bluntly. The cavity was irrigated with the rest of the anesthetic in the syringe and packed with 3 inches of  the iodoform gauze.   The wound was dressed with antibiotic ointment and Telfa pad.  Tolerated well. Complications: None.   Wound instructions provided.   Wound instructions : change dressing once a day or twice a day is needed. Change dressing after  shower in the morning.  Pat dry the wound with gauze. Pull out one inch of packing everyday and cut it off. Re-dress wound with antibiotic ointment and Telfa pad or a Band-Aid of appropriate size.   Please contact us if you notice a recollection of pus in the abscess fever and  chills increased pain redness red streaks near the abscess increased swelling in the area.  Lab Results  Component Value Date   WBC 9.4 09/26/2017   HGB 12.1 09/26/2017   HCT 37.1 09/26/2017   PLT 310 09/26/2017   GLUCOSE 249 (H) 01/17/2018   ALT 12 01/17/2018   AST 14 01/17/2018   NA 135 01/17/2018   K 3.9 01/17/2018   CL 99 01/17/2018   CREATININE 0.68 01/17/2018   BUN 9 01/17/2018   CO2 30 01/17/2018   TSH 1.760 10/03/2017   HGBA1C 12.2 (H) 01/17/2018    Ct Abdomen Pelvis W Contrast  Result Date: 09/27/2017 CLINICAL DATA:  42 year old female with acute abdominal pain, nausea/vomiting and fever for 1 day. EXAM: CT ABDOMEN AND PELVIS WITH CONTRAST TECHNIQUE: Multidetector CT imaging of the abdomen and pelvis was performed using the standard protocol following bolus administration of intravenous contrast. CONTRAST:  167m ISOVUE-300 IOPAMIDOL (ISOVUE-300) INJECTION 61% COMPARISON:  None. FINDINGS: Lower chest: Minimal LEFT basilar atelectasis/scarring noted.  Hepatobiliary: Possible mild hepatic steatosis noted. No focal hepatic lesions identified. Gallbladder is unremarkable. No biliary dilatation. Pancreas: Unremarkable Spleen: Unremarkable Adrenals/Urinary Tract: The kidneys, adrenal glands and bladder are unremarkable except for a small LEFT renal cyst. Stomach/Bowel: Stomach is within normal limits. Appendix appears normal. No evidence of bowel wall thickening, distention, or inflammatory changes. Vascular/Lymphatic: No significant vascular findings are present. No enlarged abdominal or pelvic lymph nodes. Reproductive: Mild heterogeneity of the uterus noted and may represent fibroids. No other significant abnormalities noted. Other: No ascites, focal collection or pneumoperitoneum. Musculoskeletal: A small umbilical hernia containing fat and small immediately adjacent supraumbilical hernia containing fat noted. IMPRESSION: 1. No evidence of acute abnormality. 2. Small umbilical and  supraumbilical hernias containing fat. 3. Question mild hepatic steatosis. 4. Possible uterine fibroids. Electronically Signed   By: Margarette Canada M.D.   On: 09/27/2017 12:47    Assessment & Plan:   There are no diagnoses linked to this encounter.   No orders of the defined types were placed in this encounter.    Follow-up: No follow-ups on file.  Walker Kehr, MD

## 2018-05-08 MED FILL — HUMALOG 100 UNITS/ML KWIKPE: 100 | 63 days supply | Qty: 15 | Fill #1

## 2018-05-08 MED FILL — LANTUS SOLOSTAR 100 UNITS/M: 100 | 30 days supply | Qty: 6 | Fill #2

## 2018-05-22 ENCOUNTER — Telehealth: Payer: Self-pay

## 2018-05-22 NOTE — Telephone Encounter (Signed)
Prior Authorization initiated on 1/2/220 for freestyle Carlos reader though cover my meds. Authorization key code Mayes. Awaiting response from insurance for either denial or approval.

## 2018-05-23 LAB — HM DIABETES EYE EXAM

## 2018-05-27 ENCOUNTER — Encounter: Payer: Self-pay | Admitting: Family

## 2018-05-27 NOTE — Progress Notes (Signed)
Outside notes received. Information abstracted. Notes sent to scan.  

## 2018-05-29 ENCOUNTER — Ambulatory Visit: Payer: 59 | Admitting: Internal Medicine

## 2018-06-09 ENCOUNTER — Ambulatory Visit: Payer: 59 | Admitting: Internal Medicine

## 2018-06-18 MED FILL — LANTUS SOLOSTAR 100 UNITS/M: 100 | 30 days supply | Qty: 6 | Fill #3

## 2018-06-20 MED FILL — LISINOPRIL 20 MG TABLET: 20 | 90 days supply | Qty: 180 | Fill #1

## 2018-06-20 MED FILL — METOPROLOL TARTRATE 25 MG T: 25 | 90 days supply | Qty: 180 | Fill #0

## 2018-07-03 MED FILL — HUMALOG 100 UNITS/ML KWIKPE: 100 | 63 days supply | Qty: 15 | Fill #2 | Status: TO

## 2018-07-05 ENCOUNTER — Other Ambulatory Visit: Payer: Self-pay | Admitting: Urgent Care

## 2018-07-05 ENCOUNTER — Other Ambulatory Visit: Payer: Self-pay | Admitting: Internal Medicine

## 2018-07-07 MED ORDER — FREESTYLE LIBRE 14 DAY READER DEVI: 1.0000 "application " | 0 refills | Status: DC

## 2018-07-08 ENCOUNTER — Telehealth: Payer: Self-pay

## 2018-07-08 NOTE — Telephone Encounter (Signed)
PA initiated via [email or web address] for [employer] 14 day reader.  Key: [id number]  Rx #: [id number]

## 2018-07-09 MED FILL — metFORMIN HCL ER 750 MG TB2: 750 | 90 days supply | Qty: 90 | Fill #3

## 2018-07-18 MED FILL — LANTUS SOLOSTAR 100 UNITS/M: 100 | 30 days supply | Qty: 6 | Fill #4 | Status: TO

## 2018-07-18 MED FILL — FREESTYLE LITE TEST STRIP: 90 days supply | Qty: 100 | Fill #1

## 2018-07-24 ENCOUNTER — Ambulatory Visit (INDEPENDENT_AMBULATORY_CARE_PROVIDER_SITE_OTHER): Payer: 59 | Admitting: Family Medicine

## 2018-07-24 VITALS — BP 130/88 | HR 91 | Temp 98.1°F | Ht 64.0 in | Wt 215.0 lb

## 2018-07-24 DIAGNOSIS — E1165 Type 2 diabetes mellitus with hyperglycemia: Secondary | ICD-10-CM

## 2018-07-24 DIAGNOSIS — L02219 Cutaneous abscess of trunk, unspecified: Secondary | ICD-10-CM | POA: Diagnosis not present

## 2018-07-24 MED ORDER — MUPIROCIN 2 % EX OINT: TOPICAL_OINTMENT | CUTANEOUS | 0 refills | Status: DC

## 2018-07-24 MED ORDER — CEPHALEXIN 500 MG PO CAPS: 500.0000 mg | ORAL_CAPSULE | Freq: Four times a day (QID) | ORAL | 0 refills | Status: DC

## 2018-07-24 MED FILL — MUPIROCIN 2% OINTMENT: 2 | 20 days supply | Qty: 22 | Fill #0

## 2018-07-24 MED FILL — CEPHALEXIN 500 MG CAPSULE: 500 | 7 days supply | Qty: 28 | Fill #0

## 2018-07-24 NOTE — Progress Notes (Signed)
Subjective:    Patient ID: Jeanette Meyer, female    DOB: 04/30/1976, 43 y.o.   MRN: [id number]  HPI  Jeanette Meyer is a 43 year old female who presents today a "knot" on left inner thigh that has been present for 6 days.  Erythema: Mild Warmth: Mild Edema: Mild Fever: No Chills: No Malaise: No Headache:No  She states that the area "opened up" yesterday and drained a small amount of yellow/clear drainage. She treated with OTC antibiotic ointment and covered with bandaid that provided benefit.  She has a history of developing an abscess in this area. The area will typically enlarge and spontaneously drain. Last treatment was 04/29/2018; I & D performed, cephalexin provided, and area healed with difficulty. This has also been noted in her history previously by a women's health provider.  She stated that she notices this when she has her cycle and wears a pad.   Pertinent History:  Diabetes, CBG yesterday in the am 150.  She is not a smoker     Review of Systems  Constitutional: Negative for chills and fatigue.  Respiratory: Negative for cough, shortness of breath and wheezing.   Cardiovascular: Negative for chest pain and palpitations.  Gastrointestinal: Negative for abdominal pain, diarrhea, nausea and vomiting.  Musculoskeletal: Negative for myalgias.  Skin: Positive for wound. Negative for rash.  Neurological: Negative for dizziness and headaches.   Past Medical History:  Diagnosis Date  . Diabetes mellitus without complication ([place])   . Hypertension      Social History   Socioeconomic History  . Marital status: Single    Spouse name: Not on file  . Number of children: Not on file  . Years of education: Not on file  . Highest education level: Not on file  Occupational History  . Not on file  Social Needs  . Financial resource strain: Not on file  . Food insecurity:    Worry: Not on file    Inability: Not on file  . Transportation needs:    Medical: Not on  file    Non-medical: Not on file  Tobacco Use  . Smoking status: Never Smoker  . Smokeless tobacco: Never Used  Substance and Sexual Activity  . Alcohol use: Not Currently  . Drug use: Never  . Sexual activity: Yes  Lifestyle  . Physical activity:    Days per week: Not on file    Minutes per session: Not on file  . Stress: Not on file  Relationships  . Social connections:    Talks on phone: Not on file    Gets together: Not on file    Attends religious service: Not on file    Active member of club or organization: Not on file    Attends meetings of clubs or organizations: Not on file    Relationship status: Not on file  . Intimate partner violence:    Fear of current or ex partner: Not on file    Emotionally abused: Not on file    Physically abused: Not on file    Forced sexual activity: Not on file  Other Topics Concern  . Not on file  Social History Narrative  . Not on file    Past Surgical History:  Procedure Laterality Date  . MYOMECTOMY    . RECTAL POLYPECTOMY      Family History  Problem Relation Age of Onset  . Diabetes Mother   . Hypertension Mother   . Diabetes Maternal Grandmother   .  Cancer Maternal Grandmother   . Hypertension Maternal Grandmother     Allergies  Allergen Reactions  . Bactrim [Sulfamethoxazole-Trimethoprim] Diarrhea and Nausea And Vomiting  . Clarithromycin Anaphylaxis  . Doxycycline Anaphylaxis  . Latex     Current Outpatient Medications on File Prior to Visit  Medication Sig Dispense Refill  . Acetaminophen-Codeine (TYLENOL/CODEINE #3) 300-30 MG tablet Take 1-2 tablets by mouth every 6 (six) hours as needed for pain. 20 tablet 0  . amLODipine (NORVASC) 5 MG tablet Take 1 tablet (5 mg total) by mouth daily. 90 tablet 1  . Blood Glucose Monitoring Suppl (BLOOD GLUCOSE MONITOR SYSTEM) w/Device KIT Dispense Freestyle meter or per insurance coverage. Check glucose once daily - ICD 10- E11.65 1 each 0  . cetirizine (ZYRTEC) 10 MG  tablet Take 10 mg by mouth daily.    . Continuous Blood Gluc Receiver (FREESTYLE LIBRE 14 DAY READER) DEVI 1 application by Does not apply route 4 (four) times a week. 1 Device 0  . glucose blood test strip Check fasting blood sugar once daily. 150 each 12  . Insulin Glargine (LANTUS SOLOSTAR) 100 UNIT/ML Solostar Pen Inject 20 units insulin at night as directed 5 pen 5  . insulin lispro (HUMALOG KWIKPEN) 100 UNIT/ML KiwkPen Inject 0.08 mLs (8 Units total) into the skin 3 (three) times daily. 15 mL 11  . Lancets (FREESTYLE) lancets Use as instructed 100 each 12  . lisinopril (PRINIVIL,ZESTRIL) 20 MG tablet Take 1 tablet (20 mg total) by mouth 2 (two) times daily. 180 tablet 1  . metFORMIN (GLUCOPHAGE-XR) 750 MG 24 hr tablet Take 1 tablet (750 mg total) by mouth 2 (two) times daily. 180 tablet 1  . metoprolol tartrate (LOPRESSOR) 25 MG tablet Take 1 tablet (25 mg total) by mouth 2 (two) times daily. 180 tablet 1  . HUMALOG KWIKPEN 100 UNIT/ML KwikPen      Current Facility-Administered Medications on File Prior to Visit  Medication Dose Route Frequency Provider Last Rate Last Dose  . Insulin Pen Needle (NOVOFINE) 30 each  3 packet Subcutaneous PRN Shamleffer, Melanie Crazier, MD        BP 130/88 (BP Location: Left Arm, Patient Position: Sitting, Cuff Size: Normal)   Pulse 91   Temp 98.1 F (36.7 C) (Oral)   Ht '5\' 4"'$  (1.626 m)   Wt 215 lb (97.5 kg)   SpO2 98%   BMI 36.90 kg/m         Objective:   Physical Exam Constitutional:      Appearance: Normal appearance. She is obese.  HENT:     Mouth/Throat:     Mouth: Mucous membranes are moist.     Pharynx: Oropharynx is clear.  Eyes:     General: No scleral icterus.    Pupils: Pupils are equal, round, and reactive to light.  Neck:     Musculoskeletal: Neck supple.  Cardiovascular:     Rate and Rhythm: Normal rate and regular rhythm.     Pulses: Normal pulses.     Heart sounds: Normal heart sounds.  Pulmonary:     Effort:  Pulmonary effort is normal.     Breath sounds: Normal breath sounds.  Abdominal:     General: Bowel sounds are normal.     Palpations: Abdomen is soft.  Musculoskeletal:     Comments: 1.5 cm x 1.0 cm area located on lateral left pubic area with mild edema and approximately 2 mm opening draining a  small amount of clear/yellow drainage. No erythema present.  Scar tissue appears to be present  Lymphadenopathy:     Cervical: No cervical adenopathy.  Skin:    General: Skin is warm and dry.  Neurological:     Mental Status: She is alert.  Psychiatric:        Mood and Affect: Mood normal.        Behavior: Behavior normal.        Thought Content: Thought content normal.        Judgment: Judgment normal.           Assessment & Plan:  1. Abscess of pubic region Spontaneous drainage occurred, no erythema present, no tenderness present today. Will provide cephalexin due to history of uncontrolled T2DM, advised cleaning area with liquid soap and water, dry area, and apply mupirocin ointment twice daily, cover with nonstick dressing. Advised follow up with PCP due to history of this occurring as she may consider referral  for further evaluation to dermatology. We discussed that scar tissue is present also. Further advised use of warm compresses twice daily and further evaluation is needed if she experiences increased swelling and drainage, fever,chills, pain, redness, or red streaks in area. She voiced understanding and agreed with plan.  - cephALEXin (KEFLEX) 500 MG capsule; Take 1 capsule (500 mg total) by mouth 4 (four) times daily.  Dispense: 28 capsule; Refill: 0 - mupirocin ointment (BACTROBAN) 2 %; Use qd-bid  Dispense: 22 g; Refill: 0  2. Uncontrolled type 2 diabetes mellitus with hyperglycemia ([place]) Reviewed importance of monitoring diet, blood sugar, and adherence to medication regimen. She will continue to check blood sugar and follow up with PCP as recommended. She states she is now  due and will schedule appointment.   Delano Metz, FNP-C

## 2018-07-24 NOTE — Patient Instructions (Addendum)
It was a pleasure to meet you today!  Please take antibiotic as directed.  Wash area with liquid soap, dry, and apply ointment twice daily and you can cover with a nonstick dressing.  Warm compresses can also be applied twice daily and no more than 15 to 20 minutes at a time.   If symptoms worsen or new symptoms appear such as fever, increased swelling, or red streaks, please seek further evaluation.  Advise follow up with Mickel Baas for further evaluation in one week or sooner if needed.    Cellulitis, Adult  Cellulitis is a skin infection. The infected area is often warm, red, swollen, and sore. It occurs most often in the arms and lower legs. It is very important to get treated for this condition. What are the causes? This condition is caused by bacteria. The bacteria enter through a break in the skin, such as a cut, burn, insect bite, open sore, or crack. What increases the risk? This condition is more likely to occur in people who:  Have a weak body defense system (immune system).  Have open cuts, burns, bites, or scrapes on the skin.  Are older than 43 years of age.  Have a blood sugar problem (diabetes).  Have a long-lasting (chronic) liver disease (cirrhosis) or kidney disease.  Are very overweight (obese).  Have a skin problem, such as: ? Itchy rash (eczema). ? Slow movement of blood in the veins (venous stasis). ? Fluid buildup below the skin (edema).  Have been treated with high-energy rays (radiation).  Use IV drugs. What are the signs or symptoms? Symptoms of this condition include:  Skin that is: ? Red. ? Streaking. ? Spotting. ? Swollen. ? Sore or painful when you touch it. ? Warm.  A fever.  Chills.  Blisters. How is this diagnosed? This condition is diagnosed based on:  Medical history.  Physical exam.  Blood tests.  Imaging tests. How is this treated? Treatment for this condition may include:  Medicines to treat infections or  allergies.  Home care, such as: ? Rest. ? Placing cold or warm cloths (compresses) on the skin.  Hospital care, if the condition is very bad. Follow these instructions at home: Medicines  Take over-the-counter and prescription medicines only as told by your doctor.  If you were prescribed an antibiotic medicine, take it as told by your doctor. Do not stop taking it even if you start to feel better. General instructions   Drink enough fluid to keep your pee (urine) pale yellow.  Do not touch or rub the infected area.  Raise (elevate) the infected area above the level of your heart while you are sitting or lying down.  Place cold or warm cloths on the area as told by your doctor.  Keep all follow-up visits as told by your doctor. This is important. Contact a doctor if:  You have a fever.  You do not start to get better after 1-2 days of treatment.  Your bone or joint under the infected area starts to hurt after the skin has healed.  Your infection comes back. This can happen in the same area or another area.  You have a swollen bump in the area.  You have new symptoms.  You feel ill and have muscle aches and pains. Get help right away if:  Your symptoms get worse.  You feel very sleepy.  You throw up (vomit) or have watery poop (diarrhea) for a long time.  You see red streaks coming  from the area.  Your red area gets larger.  Your red area turns dark in color. These symptoms may represent a serious problem that is an emergency. Do not wait to see if the symptoms will go away. Get medical help right away. Call your local emergency services (911 in the [place] Do not drive yourself to the hospital. Summary  Cellulitis is a skin infection. The area is often warm, red, swollen, and sore.  This condition is treated with medicines, rest, and cold and warm cloths.  Take all medicines only as told by your doctor.  Tell your doctor if symptoms do not start to get  better after 1-2 days of treatment. This information is not intended to replace advice given to you by your health care provider. Make sure you discuss any questions you have with your health care provider. Document Released: 10/24/2007 Document Revised: 09/26/2017 Document Reviewed: 09/26/2017 Elsevier Interactive Patient Education  2019 [employer].

## 2018-07-30 ENCOUNTER — Ambulatory Visit: Payer: 59 | Admitting: Internal Medicine

## 2018-08-14 ENCOUNTER — Other Ambulatory Visit: Payer: Self-pay | Admitting: Urgent Care

## 2018-08-14 ENCOUNTER — Other Ambulatory Visit: Payer: Self-pay | Admitting: Family

## 2018-08-14 MED FILL — LANTUS SOLOSTAR 100 UNITS/M: 100 | 30 days supply | Qty: 6 | Fill #0

## 2018-09-04 MED FILL — HUMALOG 100 UNITS/ML KWIKPE: 100 | 63 days supply | Qty: 15 | Fill #0

## 2018-09-06 MED FILL — LANTUS SOLOSTAR 100 UNITS/M: 100 | 30 days supply | Qty: 6 | Fill #1 | Status: TO

## 2018-10-01 ENCOUNTER — Ambulatory Visit: Payer: 59 | Admitting: Family

## 2018-10-01 ENCOUNTER — Encounter: Payer: Self-pay | Admitting: Family

## 2018-10-01 NOTE — Progress Notes (Signed)
Jeanette Meyer is a 43 y.o. female with the following history as recorded in EpicCare:  Patient Active Problem List   Diagnosis Date Noted  . Abscess of pubic region 04/29/2018  . Essential hypertension 01/17/2018  . Uncontrolled type 2 diabetes mellitus ([place]) 01/17/2018  . H/O rectal polypectomy 01/17/2018  . Fibroid uterus 11/06/2017  . Abnormal uterine bleeding (AUB) 11/06/2017  . Abscess of groin, left 11/06/2017    Current Outpatient Medications  Medication Sig Dispense Refill  . Acetaminophen-Codeine (TYLENOL/CODEINE #3) 300-30 MG tablet Take 1-2 tablets by mouth every 6 (six) hours as needed for pain. 20 tablet 0  . amLODipine (NORVASC) 5 MG tablet Take 1 tablet (5 mg total) by mouth daily. 90 tablet 1  . Blood Glucose Monitoring Suppl (BLOOD GLUCOSE MONITOR SYSTEM) w/Device KIT Dispense Freestyle meter or per insurance coverage. Check glucose once daily - ICD 10- E11.65 1 each 0  . cephALEXin (KEFLEX) 500 MG capsule Take 1 capsule (500 mg total) by mouth 4 (four) times daily. 28 capsule 0  . cetirizine (ZYRTEC) 10 MG tablet Take 10 mg by mouth daily.    . Continuous Blood Gluc Receiver (FREESTYLE LIBRE 14 DAY READER) DEVI 1 application by Does not apply route 4 (four) times a week. 1 Device 0  . glucose blood test strip Check fasting blood sugar once daily. 150 each 12  . HUMALOG KWIKPEN 100 UNIT/ML KwikPen     . Insulin Glargine (LANTUS SOLOSTAR) 100 UNIT/ML Solostar Pen Inject 20 units insulin at night as directed 5 pen 5  . insulin lispro (HUMALOG KWIKPEN) 100 UNIT/ML KiwkPen Inject 0.08 mLs (8 Units total) into the skin 3 (three) times daily. 15 mL 11  . Lancets (FREESTYLE) lancets Use as instructed 100 each 12  . lisinopril (PRINIVIL,ZESTRIL) 20 MG tablet TAKE 1 TABLET BY MOUTH 2 TIMES DAILY. 180 tablet 0  . metFORMIN (GLUCOPHAGE-XR) 750 MG 24 hr tablet Take 1 tablet (750 mg total) by mouth 2 (two) times daily. 180 tablet 1  . metoprolol tartrate (LOPRESSOR) 25 MG tablet  Take 1 tablet (25 mg total) by mouth 2 (two) times daily. 180 tablet 1  . mupirocin ointment (BACTROBAN) 2 % Use qd-bid 22 g 0   Current Facility-Administered Medications  Medication Dose Route Frequency Provider Last Rate Last Dose  . Insulin Pen Needle (NOVOFINE) 30 each  3 packet Subcutaneous PRN Shamleffer, Melanie Crazier, MD        Allergies: Bactrim [sulfamethoxazole-trimethoprim]; Clarithromycin; Doxycycline; and Latex  Past Medical History:  Diagnosis Date  . Diabetes mellitus without complication ([place])   . Hypertension     Past Surgical History:  Procedure Laterality Date  . MYOMECTOMY    . RECTAL POLYPECTOMY      Family History  Problem Relation Age of Onset  . Diabetes Mother   . Hypertension Mother   . Diabetes Maternal Grandmother   . Cancer Maternal Grandmother   . Hypertension Maternal Grandmother     Social History   Tobacco Use  . Smoking status: Never Smoker  . Smokeless tobacco: Never Used  Substance Use Topics  . Alcohol use: Not Currently    Subjective:    Objective:  There were no vitals filed for this visit.  1. Essential hypertension     Plan:   Patient was a no-show for her virtual visit to do 6 month follow-up on her blood pressure. We have mailed her a letter asking her to re-schedule since she has not responded to any of our  phone calls regarding scheduling an appointment.   No follow-ups on file.  No orders of the defined types were placed in this encounter.   Requested Prescriptions    No prescriptions requested or ordered in this encounter

## 2018-10-16 MED FILL — LANTUS SOLOSTAR 100 UNITS/M: 100 | 30 days supply | Qty: 6 | Fill #0

## 2018-10-17 MED FILL — FREESTYLE LITE TEST STRIP: 90 days supply | Qty: 100 | Fill #2

## 2018-10-23 ENCOUNTER — Other Ambulatory Visit: Payer: Self-pay | Admitting: Family

## 2018-10-24 ENCOUNTER — Encounter: Payer: Self-pay | Admitting: Family

## 2018-10-24 ENCOUNTER — Ambulatory Visit (INDEPENDENT_AMBULATORY_CARE_PROVIDER_SITE_OTHER): Payer: 59 | Admitting: Family

## 2018-10-24 VITALS — BP 134/83

## 2018-10-24 DIAGNOSIS — E1165 Type 2 diabetes mellitus with hyperglycemia: Secondary | ICD-10-CM | POA: Diagnosis not present

## 2018-10-24 DIAGNOSIS — L02214 Cutaneous abscess of groin: Secondary | ICD-10-CM | POA: Diagnosis not present

## 2018-10-24 DIAGNOSIS — I10 Essential (primary) hypertension: Secondary | ICD-10-CM | POA: Diagnosis not present

## 2018-10-24 DIAGNOSIS — Z1231 Encounter for screening mammogram for malignant neoplasm of breast: Secondary | ICD-10-CM | POA: Diagnosis not present

## 2018-10-24 MED ORDER — LISINOPRIL 20 MG PO TABS: 20.0000 mg | ORAL_TABLET | Freq: Two times a day (BID) | ORAL | 1 refills | Status: DC

## 2018-10-24 MED ORDER — METOPROLOL TARTRATE 25 MG PO TABS: 25.0000 mg | ORAL_TABLET | Freq: Two times a day (BID) | ORAL | 1 refills | Status: DC

## 2018-10-24 MED ORDER — FLUCONAZOLE 150 MG PO TABS: 150.0000 mg | ORAL_TABLET | Freq: Once | ORAL | 0 refills | Status: DC

## 2018-10-24 MED FILL — LISINOPRIL 20 MG TABLET: 20 | 90 days supply | Qty: 180 | Fill #0

## 2018-10-24 MED FILL — FLUCONAZOLE 150 MG TABS: 150 | 3 days supply | Qty: 2 | Fill #0

## 2018-10-24 MED FILL — METOPROLOL TARTRATE 25 MG T: 25 | 90 days supply | Qty: 180 | Fill #0

## 2018-10-24 NOTE — Progress Notes (Signed)
Jeanette Meyer is a 43 y.o. female with the following history as recorded in EpicCare:  Patient Active Problem List   Diagnosis Date Noted  . Abscess of pubic region 04/29/2018  . Essential hypertension 01/17/2018  . Uncontrolled type 2 diabetes mellitus ([place]) 01/17/2018  . H/O rectal polypectomy 01/17/2018  . Fibroid uterus 11/06/2017  . Abnormal uterine bleeding (AUB) 11/06/2017  . Abscess of groin, left 11/06/2017    Current Outpatient Medications  Medication Sig Dispense Refill  . Acetaminophen-Codeine (TYLENOL/CODEINE #3) 300-30 MG tablet Take 1-2 tablets by mouth every 6 (six) hours as needed for pain. 20 tablet 0  . Blood Glucose Monitoring Suppl (BLOOD GLUCOSE MONITOR SYSTEM) w/Device KIT Dispense Freestyle meter or per insurance coverage. Check glucose once daily - ICD 10- E11.65 1 each 0  . cetirizine (ZYRTEC) 10 MG tablet Take 10 mg by mouth daily.    . Continuous Blood Gluc Receiver (FREESTYLE LIBRE 14 DAY READER) DEVI 1 application by Does not apply route 4 (four) times a week. 1 Device 0  . fluconazole (DIFLUCAN) 150 MG tablet Take 1 tablet (150 mg total) by mouth once for 1 dose. Repeat after 72 hours 2 tablet 0  . glucose blood test strip Check fasting blood sugar once daily. 150 each 12  . HUMALOG KWIKPEN 100 UNIT/ML KwikPen     . Insulin Glargine (LANTUS SOLOSTAR) 100 UNIT/ML Solostar Pen Inject 20 units insulin at night as directed 5 pen 5  . insulin lispro (HUMALOG KWIKPEN) 100 UNIT/ML KiwkPen Inject 0.08 mLs (8 Units total) into the skin 3 (three) times daily. 15 mL 11  . Lancets (FREESTYLE) lancets Use as instructed 100 each 12  . lisinopril (ZESTRIL) 20 MG tablet Take 1 tablet (20 mg total) by mouth 2 (two) times daily. 180 tablet 1  . metFORMIN (GLUCOPHAGE-XR) 750 MG 24 hr tablet Take 1 tablet (750 mg total) by mouth 2 (two) times daily. 180 tablet 1  . metoprolol tartrate (LOPRESSOR) 25 MG tablet Take 1 tablet (25 mg total) by mouth 2 (two) times daily. 180 tablet  1  . mupirocin ointment (BACTROBAN) 2 % Use qd-bid 22 g 0   Current Facility-Administered Medications  Medication Dose Route Frequency Provider Last Rate Last Dose  . Insulin Pen Needle (NOVOFINE) 30 each  3 packet Subcutaneous PRN Shamleffer, Melanie Crazier, MD        Allergies: Bactrim [sulfamethoxazole-trimethoprim]; Clarithromycin; Doxycycline; and Latex  Past Medical History:  Diagnosis Date  . Diabetes mellitus without complication ([place])   . Hypertension     Past Surgical History:  Procedure Laterality Date  . MYOMECTOMY    . RECTAL POLYPECTOMY      Family History  Problem Relation Age of Onset  . Diabetes Mother   . Hypertension Mother   . Diabetes Maternal Grandmother   . Cancer Maternal Grandmother   . Hypertension Maternal Grandmother     Social History   Tobacco Use  . Smoking status: Never Smoker  . Smokeless tobacco: Never Used  Substance Use Topics  . Alcohol use: Not Currently    Subjective:  Visit was completed over phone call as connection issues with Doxy today; 1) Follow-up on hypertension- last OV with me was 03/2018; patient checking at hospital and averaging 134/83; did not start Amlodipine; only on Lisinopril and Metoprolol; Denies any chest pain, shortness of breath, blurred vision or headache. 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      Objective:  There were no vitals filed for this visit.  General: Well developed, well nourished, in no acute distress  Skin : Warm and dry.  Lungs: Respirations unlabored;  Neurologic: Alert and oriented; speech intact;  Assessment:  1. Essential hypertension   2. Abscess of left groin   3. Screening mammogram,  encounter for   4. Uncontrolled type 2 diabetes mellitus with hyperglycemia ([place])     Plan:  1. Stable; refills updated; if labs are not done with endocrine on Monday, patient to follow-up here; otherwise, follow-up in 4 months; 2. Refer to surgeon; 3. Mammogram scheduled; 4. Stressed need to keep planned follow-up with endocrine;  Spent 20 minutes on the phone with patient today;   No follow-ups on file.  Orders Placed This Encounter  Procedures  . MM Digital Screening    Standing Status:   Future    Standing Expiration Date:   12/24/2019    Order Specific Question:   Reason for Exam (SYMPTOM  OR DIAGNOSIS REQUIRED)    Answer:   screening mammogram    Order Specific Question:   Is the patient pregnant?    Answer:   No    Order Specific Question:   Preferred imaging location?    Answer:   [facility]  . Ambulatory referral to General Surgery    Referral Priority:   Routine    Referral Type:   Surgical    Referral Reason:   Specialty Services Required    Requested Specialty:   General Surgery    Number of Visits Requested:   1    Requested Prescriptions   Signed Prescriptions Disp Refills  . fluconazole (DIFLUCAN) 150 MG tablet 2 tablet 0    Sig: Take 1 tablet (150 mg total) by mouth once for 1 dose. Repeat after 72 hours  . lisinopril (ZESTRIL) 20 MG tablet 180 tablet 1    Sig: Take 1 tablet (20 mg total) by mouth 2 (two) times daily.  . metoprolol tartrate (LOPRESSOR) 25 MG tablet 180 tablet 1    Sig: Take 1 tablet (25 mg total) by mouth 2 (two) times daily.

## 2018-10-27 ENCOUNTER — Other Ambulatory Visit: Payer: Self-pay

## 2018-10-27 ENCOUNTER — Encounter: Payer: Self-pay | Admitting: Internal Medicine

## 2018-10-27 ENCOUNTER — Ambulatory Visit (INDEPENDENT_AMBULATORY_CARE_PROVIDER_SITE_OTHER): Payer: 59 | Admitting: Internal Medicine

## 2018-10-27 VITALS — BP 118/72 | HR 93 | Temp 98.3°F | Ht 64.0 in | Wt 220.2 lb

## 2018-10-27 DIAGNOSIS — Z9189 Other specified personal risk factors, not elsewhere classified: Secondary | ICD-10-CM | POA: Diagnosis not present

## 2018-10-27 DIAGNOSIS — E1165 Type 2 diabetes mellitus with hyperglycemia: Secondary | ICD-10-CM

## 2018-10-27 DIAGNOSIS — I10 Essential (primary) hypertension: Secondary | ICD-10-CM | POA: Diagnosis not present

## 2018-10-27 LAB — BASIC METABOLIC PANEL
BUN: 11 mg/dL (ref 6–23)
CO2: 24 mEq/L (ref 19–32)
Calcium: 9.1 mg/dL (ref 8.4–10.5)
Chloride: 106 mEq/L (ref 96–112)
Creatinine, Ser: 0.63 mg/dL (ref 0.40–1.20)
GFR: 124.56 mL/min (ref >60.00)
Glucose, Bld: 154 mg/dL — ABNORMAL HIGH (ref 70–99)
Potassium: 3.9 mEq/L (ref 3.5–5.1)
Sodium: 137 mEq/L (ref 135–145)

## 2018-10-27 LAB — LIPID PANEL
Cholesterol: 161 mg/dL (ref 0–200)
HDL: 48.1 mg/dL (ref >39.00)
LDL Cholesterol: 92 mg/dL (ref 0–99)
NonHDL: 112.59
Total CHOL/HDL Ratio: 3
Triglycerides: 105 mg/dL (ref 0.0–149.0)
VLDL: 21 mg/dL (ref 0.0–40.0)

## 2018-10-27 LAB — POCT GLYCOSYLATED HEMOGLOBIN (HGB A1C): Hemoglobin A1C: 10 % — AB (ref 4.0–5.6)

## 2018-10-27 MED ORDER — DAPAGLIFLOZIN PROPANEDIOL 5 MG PO TABS: 5.0000 mg | ORAL_TABLET | Freq: Every day | ORAL | 3 refills | Status: DC

## 2018-10-27 MED FILL — FARXIGA 5 MG TABLET: 5 | 30 days supply | Qty: 30 | Fill #0

## 2018-10-27 NOTE — Patient Instructions (Signed)
-   Lantus to 22 units daily - Humalog with meals between 8-10 units - Decrease metformin to 750 mg once daily  - Start Farxiga 5 mg daily        HOW TO TREAT LOW BLOOD SUGARS (Blood sugar LESS THAN 70 MG/DL)  Please follow the RULE OF 15 for the treatment of hypoglycemia treatment (when your (blood sugars are less than 70 mg/dL)    STEP 1: Take 15 grams of carbohydrates when your blood sugar is low, which includes:   3-4 GLUCOSE TABS  OR  3-4 OZ OF JUICE OR REGULAR SODA OR  ONE TUBE OF GLUCOSE GEL     STEP 2: RECHECK blood sugar in 15 MINUTES STEP 3: If your blood sugar is still low at the 15 minute recheck --> then, go back to STEP 1 and treat AGAIN with another 15 grams of carbohydrates.

## 2018-10-27 NOTE — Progress Notes (Signed)
Name: Jeanette Meyer  Age/ Sex: 43 y.o., female   MRN/ DOB: [id number], [birth year]     PCP: Marrian Salvage, [place]   Reason for Endocrinology Evaluation: Type 2 Diabetes Mellitus  Initial Endocrine Consultative Visit: 03/07/18    PATIENT IDENTIFIER: Jeanette Meyer is a 43 y.o. female with a past medical history of HTN and DM. The patient has followed with Endocrinology clinic since 03/07/18 for consultative assistance with management of her diabetes.  DIABETIC HISTORY:  Jeanette Meyer was diagnosed with T2 DM at age 1. She was initially on MDI insulin regimen ~ months. With lifestyle changes and weight loss she was able to come off insulin. She was switched to oral glycemic agents at the time. Over the years she has tried Trulitcity which caused palpitations, Januvia caused joint pains. She has also tried [place] but due to a single episode of UTI patient states this was discontinued by PCP, Lantus was added in August, 2019 to her regimen . Her hemoglobin A1c was 12.2% on her presentation to our clinic    Prandial insulin started 02/2018  SUBJECTIVE:   During the last visit (03/28/2018):She was continued on MDI regimen and metformin     Today (10/27/2018): Jeanette Meyer is here for a follow up on her diabetes management, she had missed her 2 month f/u appointment.   She checks her blood sugars 2-3 times daily. The patient has not had hypoglycemic episodes since the last clinic visit. She is not able to take metformin BID due to diarrhea. She continues to snack and consume sugar-sweetened beverages.Otherwise, the patient has not required any recent emergency interventions for hypoglycemia and has not had recent hospitalizations secondary to hyper or hypoglycemic episodes.    She has two meters that she uses (accu check and Libreview) one for home and one for work   She did have a period of fatigue and is concerned her electrolytes are off. She believe she may snore.   ROS: As per  HPI and as detailed below: Review of Systems  Constitutional: Positive for malaise/fatigue. Negative for weight loss.  HENT: Negative for congestion and sore throat.   Respiratory: Negative for cough and shortness of breath.   Cardiovascular: Negative for chest pain and leg swelling.  Genitourinary: Negative for frequency.  Skin: Negative for itching and rash.  Endo/Heme/Allergies: Negative for polydipsia.      HOME DIABETES REGIMEN:  Lantus 22 units daily Metformin 750 mg BID- takes it once a day  Humalog mostly has been taking 6-8 units   METER DOWNLOAD SUMMARY: Date range evaluated: 5/26- 10/27/2018 Fingerstick Blood Glucose Tests = 9 Average Number Tests/Day = 0.6 Overall Mean FS Glucose = 228 Standard Deviation = 96.1  BG Ranges: Low = 120 High = 396   Hypoglycemic Events/30 Days: BG < 50 = 0 Episodes of symptomatic severe hypoglycemia = 0   HISTORY:  Past Medical History:  Past Medical History:  Diagnosis Date  . Diabetes mellitus without complication ([place])   . Hypertension    Past Surgical History:  Past Surgical History:  Procedure Laterality Date  . MYOMECTOMY    . RECTAL POLYPECTOMY     Social History:  reports that she has never smoked. She has never used smokeless tobacco. She reports previous alcohol use. She reports that she does not use drugs. Family History:  Family History  Problem Relation Age of Onset  . Diabetes Mother   . Hypertension Mother   . Diabetes Maternal Grandmother   .  Cancer Maternal Grandmother   . Hypertension Maternal Grandmother      HOME MEDICATIONS: Allergies as of 10/27/2018      Reactions   Bactrim [sulfamethoxazole-trimethoprim] Diarrhea, Nausea And Vomiting   Clarithromycin Anaphylaxis   Doxycycline Anaphylaxis   Latex       Medication List       Accurate as of October 27, 2018  1:56 PM. If you have any questions, ask your nurse or doctor.        Acetaminophen-Codeine 300-30 MG tablet Commonly known as:   TYLENOL/CODEINE #3 Take 1-2 tablets by mouth every 6 (six) hours as needed for pain.   Blood Glucose Monitor System w/Device Kit Dispense Freestyle meter or per insurance coverage. Check glucose once daily - ICD 10- E11.65   cetirizine 10 MG tablet Commonly known as:  ZYRTEC Take 10 mg by mouth daily.   dapagliflozin propanediol 5 MG Tabs tablet Commonly known as:  Farxiga Take 5 mg by mouth daily. Started by:  Dorita Sciara, MD   freestyle lancets Use as instructed   FreeStyle Libre 14 Day Reader Kerrin Mo 1 application by Does not apply route 4 (four) times a week.   glucose blood test strip Check fasting blood sugar once daily.   Insulin Glargine 100 UNIT/ML Solostar Pen Commonly known as:  Lantus SoloStar Inject 20 units insulin at night as directed What changed:  how much to take   insulin lispro 100 UNIT/ML KiwkPen Commonly known as:  HumaLOG KwikPen Inject 0.08 mLs (8 Units total) into the skin 3 (three) times daily. What changed:  how much to take   HumaLOG KwikPen 100 UNIT/ML KwikPen Generic drug:  insulin lispro 6-10 Units. What changed:  Another medication with the same name was changed. Make sure you understand how and when to take each.   lisinopril 20 MG tablet Commonly known as:  ZESTRIL Take 1 tablet (20 mg total) by mouth 2 (two) times daily.   metFORMIN 750 MG 24 hr tablet Commonly known as:  GLUCOPHAGE-XR Take 1 tablet (750 mg total) by mouth 2 (two) times daily.   metoprolol tartrate 25 MG tablet Commonly known as:  LOPRESSOR Take 1 tablet (25 mg total) by mouth 2 (two) times daily.   mupirocin ointment 2 % Commonly known as:  BACTROBAN Use qd-bid        OBJECTIVE:   Vital Signs: BP 118/72 (BP Location: Left Arm, Patient Position: Sitting, Cuff Size: Large)   Pulse 93   Temp 98.3 F (36.8 C)   Ht '5\' 4"'$  (1.626 m)   Wt 220 lb 3.2 oz (99.9 kg)   SpO2 98%   BMI 37.80 kg/m   Wt Readings from Last 3 Encounters:  10/27/18 220 lb  3.2 oz (99.9 kg)  07/24/18 215 lb (97.5 kg)  04/29/18 204 lb (92.5 kg)     Exam: General: Pt appears well and is in NAD  Neck: General: Supple without adenopathy. Thyroid: Thyroid size normal.  No goiter or nodules appreciated. No thyroid bruit.  Lungs: Clear with good BS bilat with no rales, rhonchi, or wheezes  Heart: RRR with normal S1 and S2 and no gallops; no murmurs; no rub  Abdomen: Normoactive bowel sounds, soft, nontender, without masses or organomegaly palpable  Extremities: No pretibial edema. No tremor.   Skin: Normal texture and temperature to palpation. No rash noted.mild Acanthosis nigricans around the neck  Neuro: MS is good with appropriate affect, pt is alert and Ox3    DM Foot Exam:  10/27/2018 The skin of the feet is intact without sores or ulcerations. The pedal pulses are 2+ on right and 2+ on left. The sensation is intact to a screening 5.07, 10 gram monofilament bilaterally    DATA REVIEWED:  Lab Results  Component Value Date   HGBA1C 10.0 (A) 10/27/2018   HGBA1C 12.2 (H) 01/17/2018   HGBA1C 12.0 (H) 10/03/2017   Lab Results  Component Value Date   CREATININE 0.68 01/17/2018   Results for CAIDANCE, SYBERT A "ANN" (MRN [id number]) as of 10/28/2018 14:05  Ref. Range 10/27/2018 13:47  Sodium Latest Ref Range: 135 - 145 mEq/L 137  Potassium Latest Ref Range: 3.5 - 5.1 mEq/L 3.9  Chloride Latest Ref Range: 96 - 112 mEq/L 106  CO2 Latest Ref Range: 19 - 32 mEq/L 24  Glucose Latest Ref Range: 70 - 99 mg/dL 154 (H)  BUN Latest Ref Range: 6 - 23 mg/dL 11  Creatinine Latest Ref Range: 0.40 - 1.20 mg/dL 0.63  Calcium Latest Ref Range: 8.4 - 10.5 mg/dL 9.1  GFR Latest Ref Range: >60.00 mL/min 124.56  Total CHOL/HDL Ratio Unknown 3  Cholesterol Latest Ref Range: 0 - 200 mg/dL 161  HDL Cholesterol Latest Ref Range: >39.00 mg/dL 48.10  LDL (calc) Latest Ref Range: 0 - 99 mg/dL 92  NonHDL Unknown 112.59  Triglycerides Latest Ref Range: 0.0 - 149.0 mg/dL 105.0  VLDL  Latest Ref Range: 0.0 - 40.0 mg/dL 21.0    ASSESSMENT / PLAN / RECOMMENDATIONS:   1) Type 2 Diabetes Mellitus, poorly controlled, without complications - Most recent A1c of 10.0%. Goal A1c < 7.0%.  Down from 12.2%   Plan:  - Poorly controlled diabetes is due to continued dietary indiscretions. In review of her meter download, she had readings between 120-172 mg/dL but there was a day with BG 's > 300 mg/dL. This is most consistent with medication non-adherence.  - I have again encouraged her to check glucose more often, avoid sugar-sweetend beverages and avoiding snacks.  - She is intolerant to GLp-1 agonists and DPP-4 inhibitors.  - She has been on SGLT-2 inhibitors in the past with a UTI x1. After discussing the mechanism of action and risks and benefits we have agreed to give farxiga another try. She understands that 1 UTI is ok to treat with out stopping the medicine but if she gets more then one in such a short time, will stop then   MEDICATIONS: Continue Lantus at 22 units daily Humalog 8-10 units with meals based on size Decrease Metformin '750mg'$  XR Once daily  Start Farxiga 5 mg daily   EDUCATION / INSTRUCTIONS: BG monitoring instructions: Patient is instructed to check her blood sugars 4 times a day, before meals and at bedtime. Call [facility] if: BG persistently < 70 or > 300. I reviewed the Rule of 15 for the treatment of hypoglycemia in detail with the patient. Literature supplied.     2) Diabetic complications:  Eye: Does not have known diabetic retinopathy.  Neuro/ Feet: does not  have known diabetic peripheral neuropathy . Renal: Patient does not have known baseline CKD.  She is on an ACEI/ARB at present.   3) Lipids: Patient is not on a statin. Repeat lipid today shows an LDL of 92 mg/dL. Despite an LDL of  less then 100 mg/dL , statin therapy is recommended.    4) Hypertension: she is at goal of < 140/90 mmHg.    F/U in 3 months     Signed  electronically by: Mack Guise, MD  [facility]  [facility] [address], [place], [place] [address] Phone: [phone] FAX: [phone]   CC: Marrian Salvage, [place] [address] Phone: [phone]  Fax: [phone]  Return to Endocrinology clinic as below: Future Appointments  Date Time Provider [place]  01/27/2019  8:50 AM Jonahtan Manseau, Melanie Crazier, MD [facility] None  03/10/2019  9:00 AM Marrian Salvage, FNP [facility]

## 2018-10-28 DIAGNOSIS — Z9189 Other specified personal risk factors, not elsewhere classified: Secondary | ICD-10-CM | POA: Insufficient documentation

## 2018-10-29 ENCOUNTER — Encounter: Payer: Self-pay | Admitting: Internal Medicine

## 2018-11-04 ENCOUNTER — Encounter: Payer: Self-pay | Admitting: Family

## 2018-11-13 ENCOUNTER — Encounter: Payer: Self-pay | Admitting: Family

## 2018-11-13 MED FILL — LANTUS SOLOSTAR 100 UNITS/M: 100 | 30 days supply | Qty: 6 | Fill #1

## 2018-11-13 MED FILL — HUMALOG 100 UNITS/ML KWIKPE: 100 | 63 days supply | Qty: 15 | Fill #0

## 2018-11-15 ENCOUNTER — Telehealth: Payer: Self-pay

## 2018-11-15 DIAGNOSIS — Z20822 Contact with and (suspected) exposure to covid-19: Secondary | ICD-10-CM

## 2018-11-15 NOTE — Telephone Encounter (Signed)
Called pt and left message to call back for Covid 19 testing. Call back number given [phone].

## 2018-11-17 NOTE — Addendum Note (Signed)
Addended by: Dimple Nanas on: 11/17/2018 05:03 PM   Modules accepted: Orders

## 2018-11-17 NOTE — Telephone Encounter (Signed)
Jeanette Meyer, [place]  [facility] Testing Pool        Jeanette Meyer  DOB: 08-23-1975  MRN # [id number]   Reason: possible exposure   Insurance: [employer] [facility]   ID# [id number]  Group # [id number]   Contact # [phone]   Testing referred by Jeanette Meyer. Pt called and scheduled on 11/24/18 as requested at [facility] site. Pt advised to wear a mask and remain in car at the time of appt. Understanding verbalized.

## 2018-11-24 ENCOUNTER — Other Ambulatory Visit: Payer: 59

## 2018-11-24 DIAGNOSIS — Z20822 Contact with and (suspected) exposure to covid-19: Secondary | ICD-10-CM

## 2018-11-24 DIAGNOSIS — R6889 Other general symptoms and signs: Secondary | ICD-10-CM | POA: Diagnosis not present

## 2018-11-29 LAB — NOVEL CORONAVIRUS, NAA: SARS-CoV-2, NAA: NOT DETECTED

## 2018-12-10 MED FILL — FARXIGA 5 MG TABLET: 5 | 30 days supply | Qty: 30 | Fill #1

## 2018-12-19 ENCOUNTER — Other Ambulatory Visit: Payer: Self-pay

## 2018-12-19 MED ORDER — LANTUS SOLOSTAR 100 UNIT/ML ~~LOC~~ SOPN: PEN_INJECTOR | SUBCUTANEOUS | 5 refills | Status: DC

## 2018-12-19 MED FILL — LANTUS SOLOSTAR 100 UNITS/M: 100 | 75 days supply | Qty: 15 | Fill #0

## 2018-12-22 ENCOUNTER — Other Ambulatory Visit: Payer: Self-pay | Admitting: Family

## 2018-12-22 ENCOUNTER — Encounter: Payer: Self-pay | Admitting: Family

## 2018-12-22 MED FILL — FLUCONAZOLE 150 MG TABS: 150 | 3 days supply | Qty: 2 | Fill #0

## 2018-12-23 ENCOUNTER — Encounter: Payer: Self-pay | Admitting: Family

## 2019-01-01 ENCOUNTER — Ambulatory Visit: Payer: 59

## 2019-01-05 ENCOUNTER — Ambulatory Visit
Admission: RE | Admit: 2019-01-05 | Discharge: 2019-01-05 | Disposition: A | Discharge status: Home / Self Care | Payer: 59 | Source: Ambulatory Visit | Attending: Family | Admitting: Family

## 2019-01-05 ENCOUNTER — Other Ambulatory Visit: Payer: Self-pay

## 2019-01-05 DIAGNOSIS — Z1231 Encounter for screening mammogram for malignant neoplasm of breast: Secondary | ICD-10-CM

## 2019-01-06 ENCOUNTER — Other Ambulatory Visit: Payer: Self-pay | Admitting: Family

## 2019-01-06 DIAGNOSIS — R928 Other abnormal and inconclusive findings on diagnostic imaging of breast: Secondary | ICD-10-CM

## 2019-01-12 ENCOUNTER — Ambulatory Visit
Admission: RE | Admit: 2019-01-12 | Discharge: 2019-01-12 | Disposition: A | Discharge status: Home / Self Care | Payer: 59 | Source: Ambulatory Visit | Attending: Family | Admitting: Family

## 2019-01-12 ENCOUNTER — Other Ambulatory Visit: Payer: Self-pay

## 2019-01-12 ENCOUNTER — Other Ambulatory Visit: Payer: Self-pay | Admitting: Family

## 2019-01-12 DIAGNOSIS — N6011 Diffuse cystic mastopathy of right breast: Secondary | ICD-10-CM | POA: Diagnosis not present

## 2019-01-12 DIAGNOSIS — R928 Other abnormal and inconclusive findings on diagnostic imaging of breast: Secondary | ICD-10-CM | POA: Diagnosis not present

## 2019-01-12 DIAGNOSIS — N6489 Other specified disorders of breast: Secondary | ICD-10-CM

## 2019-01-12 DIAGNOSIS — N63 Unspecified lump in unspecified breast: Secondary | ICD-10-CM

## 2019-01-17 ENCOUNTER — Telehealth: Payer: 59 | Admitting: Family

## 2019-01-17 ENCOUNTER — Ambulatory Visit (INDEPENDENT_AMBULATORY_CARE_PROVIDER_SITE_OTHER)
Admission: RE | Admit: 2019-01-17 | Discharge: 2019-01-17 | Disposition: A | Discharge status: Home / Self Care | Payer: 59 | Source: Ambulatory Visit

## 2019-01-17 DIAGNOSIS — J019 Acute sinusitis, unspecified: Secondary | ICD-10-CM

## 2019-01-17 DIAGNOSIS — B9689 Other specified bacterial agents as the cause of diseases classified elsewhere: Secondary | ICD-10-CM | POA: Diagnosis not present

## 2019-01-17 DIAGNOSIS — J3489 Other specified disorders of nose and nasal sinuses: Secondary | ICD-10-CM

## 2019-01-17 MED ORDER — AMOXICILLIN-POT CLAVULANATE 875-125 MG PO TABS: 1.0000 | ORAL_TABLET | Freq: Two times a day (BID) | ORAL | 0 refills | Status: DC

## 2019-01-17 MED ORDER — PREDNISONE 20 MG PO TABS: 20.0000 mg | ORAL_TABLET | Freq: Every day | ORAL | 0 refills | Status: DC

## 2019-01-17 NOTE — ED Provider Notes (Signed)
Called patient for today's virtual visit.  Discussed that patient had already be seen by E visit through [facility]: Prescribed Augmentin for sinus infection.  Patient feels may have been a misunderstanding as she feels this is a result of an injury with her mask, denying nasal congestion, sinus pain, fever.  Advised patient follow-up with E visit provider.  Patient verbalized understanding, has contact information for them and will reach out to them today.   [place], [place], [place] 01/17/19 1023

## 2019-01-17 NOTE — Addendum Note (Signed)
Addended by: Dutch Quint B on: 01/17/2019 10:34 AM   Modules accepted: Orders

## 2019-01-17 NOTE — Progress Notes (Signed)

## 2019-01-19 MED FILL — predniSONE 20 MG TABS: 20 | 5 days supply | Qty: 5 | Fill #0

## 2019-01-19 MED FILL — HUMALOG 100 UNITS/ML KWIKPE: 100 | 63 days supply | Qty: 15 | Fill #1

## 2019-01-20 ENCOUNTER — Other Ambulatory Visit: Payer: Self-pay | Admitting: Surgery

## 2019-01-20 DIAGNOSIS — L732 Hidradenitis suppurativa: Secondary | ICD-10-CM | POA: Diagnosis not present

## 2019-01-23 ENCOUNTER — Telehealth: Payer: Self-pay

## 2019-01-23 NOTE — Telephone Encounter (Signed)
[employer] denied libre 2 reader due to pt not checking blood sugars 4 times daily and not injecting insulin 3 or more times daily or on a pump.

## 2019-01-27 ENCOUNTER — Encounter: Payer: Self-pay | Admitting: Internal Medicine

## 2019-01-27 ENCOUNTER — Other Ambulatory Visit: Payer: Self-pay

## 2019-01-27 ENCOUNTER — Ambulatory Visit (INDEPENDENT_AMBULATORY_CARE_PROVIDER_SITE_OTHER): Payer: 59 | Admitting: Internal Medicine

## 2019-01-27 VITALS — BP 136/78 | HR 104 | Temp 98.8°F | Ht 64.0 in | Wt 219.8 lb

## 2019-01-27 DIAGNOSIS — E1165 Type 2 diabetes mellitus with hyperglycemia: Secondary | ICD-10-CM | POA: Diagnosis not present

## 2019-01-27 DIAGNOSIS — E785 Hyperlipidemia, unspecified: Secondary | ICD-10-CM

## 2019-01-27 LAB — POCT GLYCOSYLATED HEMOGLOBIN (HGB A1C): Hemoglobin A1C: 9.5 % — AB (ref 4.0–5.6)

## 2019-01-27 MED ORDER — LANTUS SOLOSTAR 100 UNIT/ML ~~LOC~~ SOPN: 24.0000 [IU] | PEN_INJECTOR | Freq: Every day | SUBCUTANEOUS | 5 refills | Status: DC

## 2019-01-27 MED ORDER — FARXIGA 10 MG PO TABS: 10.0000 mg | ORAL_TABLET | Freq: Every day | ORAL | 6 refills | Status: DC

## 2019-01-27 MED ORDER — ATORVASTATIN CALCIUM 10 MG PO TABS: 10.0000 mg | ORAL_TABLET | Freq: Every day | ORAL | 3 refills | Status: DC

## 2019-01-27 MED FILL — ATORVASTATIN 10 MG TABLET: 10 | 90 days supply | Qty: 90 | Fill #0

## 2019-01-27 MED FILL — FARXIGA 10 MG TABLET: 10 | 30 days supply | Qty: 30 | Fill #0

## 2019-01-27 MED FILL — METOPROLOL TARTRATE 25 MG T: 25 | 90 days supply | Qty: 180 | Fill #1

## 2019-01-27 MED FILL — LISINOPRIL 20 MG TABLET: 20 | 90 days supply | Qty: 180 | Fill #1

## 2019-01-27 MED FILL — FREESTYLE LITE TEST STRIP: 90 days supply | Qty: 100 | Fill #3

## 2019-01-27 NOTE — Patient Instructions (Addendum)
Increase lantus to 24 units daily   Continue Humalog 6-10 units with each meal   Start Lipitor at 10 mg daily for cholesterol and heart protection       HOW TO TREAT LOW BLOOD SUGARS (Blood sugar LESS THAN 70 MG/DL)  Please follow the RULE OF 15 for the treatment of hypoglycemia treatment (when your (blood sugars are less than 70 mg/dL)    STEP 1: Take 15 grams of carbohydrates when your blood sugar is low, which includes:   3-4 GLUCOSE TABS  OR  3-4 OZ OF JUICE OR REGULAR SODA OR  ONE TUBE OF GLUCOSE GEL     STEP 2: RECHECK blood sugar in 15 MINUTES STEP 3: If your blood sugar is still low at the 15 minute recheck --> then, go back to STEP 1 and treat AGAIN with another 15 grams of carbohydrates.

## 2019-01-27 NOTE — Progress Notes (Signed)
Name: Jeanette Meyer  Age/ Sex: 43 y.o., female   MRN/ DOB: [id number], 11/18/1975     PCP: Marrian Salvage, [place]   Reason for Endocrinology Evaluation: Type 2 Diabetes Mellitus  Initial Endocrine Consultative Visit: 03/07/18    PATIENT IDENTIFIER: Jeanette Meyer is a 43 y.o. female with a past medical history of HTN and DM. The patient has followed with Endocrinology clinic since 03/07/18 for consultative assistance with management of her diabetes.  DIABETIC HISTORY:  Jeanette Meyer was diagnosed with T2 DM at age 69. She was initially on MDI insulin regimen ~ months. With lifestyle changes and weight loss she was able to come off insulin. She was switched to oral glycemic agents at the time. Over the years she has tried Trulitcity which caused palpitations, Januvia caused joint pains. She has also tried [place] but due to a single episode of UTI patient states this was discontinued by PCP, Lantus was added in August, 2019 to her regimen . Her hemoglobin A1c was 12.2% on her presentation to our clinic    Prandial insulin started 02/2018  She is intolerant to BID dosing of metformin   SUBJECTIVE:   During the last visit (10/27/2018):A1c 10.0%. We continued MDI regimen, reduced metformin and started her on Farxiga.     Today (01/27/2019): Jeanette Meyer is here for a follow up on her diabetes management.   She checks her blood sugars 3 times daily. The patient has not had hypoglycemic episodes since the last clinic visit. She continues to snack but has been cutting on sugar-sweetened beverages.Otherwise, the patient has not required any recent emergency interventions for hypoglycemia and has not had recent hospitalizations secondary to hyper or hypoglycemic episodes.    She has two meters that she uses (accu check and Libreview) one for home and one for work   ROS: As per HPI and as detailed below: Review of Systems  Constitutional: Negative for fever and weight loss.  HENT:  Negative for congestion and sore throat.   Respiratory: Negative for cough and shortness of breath.   Cardiovascular: Negative for chest pain and leg swelling.  Gastrointestinal: Negative for diarrhea and nausea.  Genitourinary: Negative for frequency.  Skin: Negative for itching and rash.  Endo/Heme/Allergies: Negative for polydipsia.      HOME DIABETES REGIMEN:  Lantus 22 units daily Metformin 750 mg daily  Humalog 6-10 units  Farxiga 5 mg daily      METER DOWNLOAD SUMMARY: Date range evaluated: 8/26-01/27/2019 Fingerstick Blood Glucose Tests = 8 Average Number Tests/Day = 0.6 Overall Mean FS Glucose = 220  BG Ranges: Low = 119 High = 351   Hypoglycemic Events/30 Days: BG < 50 = 0 Episodes of symptomatic severe hypoglycemia = 0   HISTORY:  Past Medical History:  Past Medical History:  Diagnosis Date  . Diabetes mellitus without complication ([place])   . Hypertension    Past Surgical History:  Past Surgical History:  Procedure Laterality Date  . MYOMECTOMY    . RECTAL POLYPECTOMY     Social History:  reports that she has never smoked. She has never used smokeless tobacco. She reports previous alcohol use. She reports that she does not use drugs. Family History:  Family History  Problem Relation Age of Onset  . Diabetes Mother   . Hypertension Mother   . Diabetes Maternal Grandmother   . Cancer Maternal Grandmother   . Hypertension Maternal Grandmother      HOME MEDICATIONS: Allergies as of 01/27/2019  Reactions   Bactrim [sulfamethoxazole-trimethoprim] Diarrhea, Nausea And Vomiting   Clarithromycin Anaphylaxis   Doxycycline Anaphylaxis   Latex       Medication List       Accurate as of January 27, 2019  9:34 AM. If you have any questions, ask your nurse or doctor.        STOP taking these medications   Acetaminophen-Codeine 300-30 MG tablet Commonly known as: TYLENOL/CODEINE #3 Stopped by: Dorita Sciara, MD    amoxicillin-clavulanate 875-125 MG tablet Commonly known as: AUGMENTIN Stopped by: Dorita Sciara, MD   fluconazole 150 MG tablet Commonly known as: DIFLUCAN Stopped by: Dorita Sciara, MD     TAKE these medications   atorvastatin 10 MG tablet Commonly known as: LIPITOR Take 1 tablet (10 mg total) by mouth daily. Started by: Dorita Sciara, MD   Blood Glucose Monitor System w/Device Kit Dispense Freestyle meter or per insurance coverage. Check glucose once daily - ICD 10- E11.65   cetirizine 10 MG tablet Commonly known as: ZYRTEC Take 10 mg by mouth daily.   dapagliflozin propanediol 5 MG Tabs tablet Commonly known as: Farxiga Take 5 mg by mouth daily.   freestyle lancets Use as instructed   FreeStyle Libre 14 Day Reader Kerrin Mo 1 application by Does not apply route 4 (four) times a week.   glucose blood test strip Check fasting blood sugar once daily.   insulin lispro 100 UNIT/ML KiwkPen Commonly known as: HumaLOG KwikPen Inject 0.08 mLs (8 Units total) into the skin 3 (three) times daily. What changed: how much to take   HumaLOG KwikPen 100 UNIT/ML KwikPen Generic drug: insulin lispro 6-10 Units. What changed: Another medication with the same name was changed. Make sure you understand how and when to take each.   Lantus SoloStar 100 UNIT/ML Solostar Pen Generic drug: Insulin Glargine Inject 20 units insulin at night as directed What changed:  how much to take additional instructions   lisinopril 20 MG tablet Commonly known as: ZESTRIL Take 1 tablet (20 mg total) by mouth 2 (two) times daily.   metFORMIN 750 MG 24 hr tablet Commonly known as: GLUCOPHAGE-XR Take 1 tablet (750 mg total) by mouth 2 (two) times daily.   metoprolol tartrate 25 MG tablet Commonly known as: LOPRESSOR Take 1 tablet (25 mg total) by mouth 2 (two) times daily.   mupirocin ointment 2 % Commonly known as: BACTROBAN Use qd-bid   predniSONE 20 MG  tablet Commonly known as: DELTASONE Take 1 tablet (20 mg total) by mouth daily with breakfast.        OBJECTIVE:   Vital Signs: BP 136/78 (BP Location: Left Arm, Patient Position: Sitting, Cuff Size: Large)   Pulse (!) 104   Temp 98.8 F (37.1 C)   Ht _0  (1.626 m)   Wt 219 lb 12.8 oz (99.7 kg)   LMP 12/31/2018   SpO2 98%   BMI 37.73 kg/m   Wt Readings from Last 3 Encounters:  01/27/19 219 lb 12.8 oz (99.7 kg)  10/27/18 220 lb 3.2 oz (99.9 kg)  07/24/18 215 lb (97.5 kg)     Exam: General: Pt appears well and is in NAD  Neck: General: Supple without adenopathy. Thyroid: Thyroid size normal.  No goiter or nodules appreciated. No thyroid bruit.  Lungs: Clear with good BS bilat with no rales, rhonchi, or wheezes  Heart: RRR with normal S1 and S2 and no gallops; no murmurs; no rub  Abdomen: Normoactive bowel sounds, soft, nontender,  without masses or organomegaly palpable  Extremities: No pretibial edema. No tremor.   Skin: Normal texture and temperature to palpation. No rash noted.mild Acanthosis nigricans around the neck  Neuro: MS is good with appropriate affect, pt is alert and Ox3    DM Foot Exam: 10/27/2018 The skin of the feet is intact without sores or ulcerations. The pedal pulses are 2+ on right and 2+ on left. The sensation is intact to a screening 5.07, 10 gram monofilament bilaterally    DATA REVIEWED:  Lab Results  Component Value Date   HGBA1C 9.5 (A) 01/27/2019   HGBA1C 10.0 (A) 10/27/2018   HGBA1C 12.2 (H) 01/17/2018   Lab Results  Component Value Date   LDLCALC 92 10/27/2018   CREATININE 0.63 10/27/2018   Results for CHERAY, PARDI A "ANN" (MRN [id number]) as of 10/28/2018 14:05  Ref. Range 10/27/2018 13:47  Sodium Latest Ref Range: 135 - 145 mEq/L 137  Potassium Latest Ref Range: 3.5 - 5.1 mEq/L 3.9  Chloride Latest Ref Range: 96 - 112 mEq/L 106  CO2 Latest Ref Range: 19 - 32 mEq/L 24  Glucose Latest Ref Range: 70 - 99 mg/dL 154 (H)  BUN  Latest Ref Range: 6 - 23 mg/dL 11  Creatinine Latest Ref Range: 0.40 - 1.20 mg/dL 0.63  Calcium Latest Ref Range: 8.4 - 10.5 mg/dL 9.1  GFR Latest Ref Range: >60.00 mL/min 124.56  Total CHOL/HDL Ratio Unknown 3  Cholesterol Latest Ref Range: 0 - 200 mg/dL 161  HDL Cholesterol Latest Ref Range: >39.00 mg/dL 48.10  LDL (calc) Latest Ref Range: 0 - 99 mg/dL 92  NonHDL Unknown 112.59  Triglycerides Latest Ref Range: 0.0 - 149.0 mg/dL 105.0  VLDL Latest Ref Range: 0.0 - 40.0 mg/dL 21.0    ASSESSMENT / PLAN / RECOMMENDATIONS:   1) Type 2 Diabetes Mellitus, poorly controlled, without complications - Most recent A1c of 9.5 %. Goal A1c < 7.0%.  Down from 12.2%   Plan: - A1c is trending dow but very slow.  - Poorly controlled diabetes is due to continued dietary indiscretions. In review of her meter download, she had readings between 119-300's mg/dL This is most consistent with medication non-adherence. For example she ate pasta for supper last night but did not take Humalog, she took Humalog after her dinner when he BG was already at 252 mg/dL.  - Discussed pharmacokinetics of basal/bolus insulin and the importance of taking prandial insulin with meals.  We also discussed avoiding sugar-sweetened beverages and snacks, when possible.  - I do believe that switching her to an insulin Mix would be better for her, giving her inconsistent meals and more snacking during the day, but she just picked up a new box of insulin and we decided to keep things the same at this time but will change by next visit. - She is intolerant to GLp-1 agonists and DPP-4 inhibitors.  - Will check BMP on next visit      MEDICATIONS: Increase  Lantus at 24 units daily Humalog 6-10 units with meals based on size Metformin 766m XR Once daily  Increase Farxiga 10 mg daily   EDUCATION / INSTRUCTIONS: BG monitoring instructions: Patient is instructed to check her blood sugars 4 times a day, before meals and at  bedtime. [facility] if: BG persistently < 70 or > 300. I reviewed the Rule of 15 for the treatment of hypoglycemia in detail with the patient. Literature supplied.     2) Diabetic complications:  Eye: Does  not have known diabetic retinopathy.  Neuro/ Feet: does not  have known diabetic peripheral neuropathy . Renal: Patient does not have known baseline CKD.  She is on an ACEI/ARB at present.   3) Lipids: She will be started on Lipitor 10 mg daily. Discussed cardiovascular benefits of statins     F/U in 3 months    Signed electronically by: Mack Guise, MD  [facility] Endocrinology  [facility] [address] [place], [place] [address] Phone: [phone] FAX: [phone]   CC: Marrian Salvage, [place] [address] Phone: [phone]  Fax: [phone]  Return to Endocrinology clinic as below: Future Appointments  Date Time Provider [place]  03/10/2019  9:00 AM Marrian Salvage, FNP [facility]  04/28/2019  8:10 AM Blakeley Scheier, Melanie Crazier, MD [facility] None  07/16/2019  8:00 AM [facility] DIAG TOMO 1 [facility]-BREAST CE  07/16/2019  8:10 AM [facility] [place] 1 [facility]BREAST CE

## 2019-01-30 ENCOUNTER — Encounter: Payer: Self-pay | Admitting: Internal Medicine

## 2019-02-05 ENCOUNTER — Encounter: Payer: Self-pay | Admitting: Family

## 2019-02-06 ENCOUNTER — Other Ambulatory Visit: Payer: Self-pay

## 2019-02-06 ENCOUNTER — Encounter: Payer: Self-pay | Admitting: Family

## 2019-02-06 ENCOUNTER — Ambulatory Visit (INDEPENDENT_AMBULATORY_CARE_PROVIDER_SITE_OTHER): Payer: 59 | Admitting: Family

## 2019-02-06 VITALS — BP 140/84 | HR 86 | Temp 98.5°F | Ht 64.0 in | Wt 219.0 lb

## 2019-02-06 DIAGNOSIS — G43829 Menstrual migraine, not intractable, without status migrainosus: Secondary | ICD-10-CM | POA: Diagnosis not present

## 2019-02-06 DIAGNOSIS — Z23 Encounter for immunization: Secondary | ICD-10-CM | POA: Diagnosis not present

## 2019-02-06 DIAGNOSIS — I10 Essential (primary) hypertension: Secondary | ICD-10-CM

## 2019-02-06 MED ORDER — RIZATRIPTAN BENZOATE 10 MG PO TBDP: 10.0000 mg | ORAL_TABLET | ORAL | 0 refills | Status: DC | PRN

## 2019-02-06 MED ORDER — KETOROLAC TROMETHAMINE 30 MG/ML IJ SOLN
30.0000 mg | Freq: Once | INTRAMUSCULAR | Status: AC
  Administered 2019-02-06: 30 mg via INTRAMUSCULAR

## 2019-02-06 MED ORDER — AMLODIPINE BESYLATE 5 MG PO TABS: 5.0000 mg | ORAL_TABLET | Freq: Every day | ORAL | 1 refills | Status: DC

## 2019-02-06 MED FILL — AMLODIPINE BESYLATE 5 MG TA: 5 | 60 days supply | Qty: 60 | Fill #0

## 2019-02-06 MED FILL — RIZATRIPTAN 10 MG ODT: 10 | 30 days supply | Qty: 10 | Fill #0

## 2019-02-06 NOTE — Patient Instructions (Addendum)

## 2019-02-06 NOTE — Progress Notes (Signed)
Jeanette Meyer is a 43 y.o. female with the following history as recorded in EpicCare:  Patient Active Problem List   Diagnosis Date Noted  . Dyslipidemia 01/27/2019  . At risk for cardiovascular event 10/28/2018  . Abscess of pubic region 04/29/2018  . Essential hypertension 01/17/2018  . Uncontrolled type 2 diabetes mellitus ([place]) 01/17/2018  . H/O rectal polypectomy 01/17/2018  . Fibroid uterus 11/06/2017  . Abnormal uterine bleeding (AUB) 11/06/2017  . Abscess of groin, left 11/06/2017    Current Outpatient Medications  Medication Sig Dispense Refill  . atorvastatin (LIPITOR) 10 MG tablet Take 1 tablet (10 mg total) by mouth daily. 90 tablet 3  . Blood Glucose Monitoring Suppl (BLOOD GLUCOSE MONITOR SYSTEM) w/Device KIT Dispense Freestyle meter or per insurance coverage. Check glucose once daily - ICD 10- E11.65 1 each 0  . cetirizine (ZYRTEC) 10 MG tablet Take 10 mg by mouth daily.    . Continuous Blood Gluc Receiver (FREESTYLE LIBRE 14 DAY READER) DEVI 1 application by Does not apply route 4 (four) times a week. 1 Device 0  . dapagliflozin propanediol (FARXIGA) 10 MG TABS tablet Take 10 mg by mouth daily before breakfast. 30 tablet 6  . glucose blood test strip Check fasting blood sugar once daily. 150 each 12  . HUMALOG KWIKPEN 100 UNIT/ML KwikPen 6-10 Units.     . Insulin Glargine (LANTUS SOLOSTAR) 100 UNIT/ML Solostar Pen Inject 24 Units into the skin daily. 5 pen 5  . insulin lispro (HUMALOG KWIKPEN) 100 UNIT/ML KiwkPen Inject 0.08 mLs (8 Units total) into the skin 3 (three) times daily. (Patient taking differently: Inject 6-10 Units into the skin 3 (three) times daily. ) 15 mL 11  . Lancets (FREESTYLE) lancets Use as instructed 100 each 12  . lisinopril (ZESTRIL) 20 MG tablet Take 1 tablet (20 mg total) by mouth 2 (two) times daily. 180 tablet 1  . metFORMIN (GLUCOPHAGE-XR) 750 MG 24 hr tablet Take 1 tablet (750 mg total) by mouth 2 (two) times daily. 180 tablet 1  .  metoprolol tartrate (LOPRESSOR) 25 MG tablet Take 1 tablet (25 mg total) by mouth 2 (two) times daily. 180 tablet 1  . mupirocin ointment (BACTROBAN) 2 % Use qd-bid 22 g 0  . amLODipine (NORVASC) 5 MG tablet Take 1 tablet (5 mg total) by mouth daily. 30 tablet 1  . rizatriptan (MAXALT-MLT) 10 MG disintegrating tablet Take 1 tablet (10 mg total) by mouth as needed for migraine. May repeat in 2 hours if needed 10 tablet 0   Current Facility-Administered Medications  Medication Dose Route Frequency Provider Last Rate Last Dose  . Insulin Pen Needle (NOVOFINE) 30 each  3 packet Subcutaneous PRN Shamleffer, Melanie Crazier, MD        Allergies: Bactrim [sulfamethoxazole-trimethoprim], Clarithromycin, Doxycycline, and Latex  Past Medical History:  Diagnosis Date  . Diabetes mellitus without complication ([place])   . Hypertension     Past Surgical History:  Procedure Laterality Date  . MYOMECTOMY    . RECTAL POLYPECTOMY      Family History  Problem Relation Age of Onset  . Diabetes Mother   . Hypertension Mother   . Diabetes Maternal Grandmother   . Cancer Maternal Grandmother   . Hypertension Maternal Grandmother     Social History   Tobacco Use  . Smoking status: Never Smoker  . Smokeless tobacco: Never Used  Substance Use Topics  . Alcohol use: Not Currently    Subjective:  Follow-up on hypertension; currently taking  Lisinopril 20 mg daily and Metoprolol 25 mg bid; does check her pressure regularly- it averages 136-140/90; continuing to work with endocrine for Type 2 Diabetes- uncontrolled but control is improving;             Denies any chest pain, shortness of breath, blurred vision or headache.  Also mentions that she has had migraine for the past 2 days; problems with migraines for 20+ years; most often occur around time of her period; feels like today she is still having some "remnants" of her headache;    Objective:  Vitals:   02/06/19 0918  BP: 140/84  Pulse: 86  Temp:  98.5 F (36.9 C)  TempSrc: Oral  SpO2: 98%  Weight: 219 lb 0.6 oz (99.4 kg)  Height: _0  (1.626 m)    General: Well developed, well nourished, in no acute distress  Skin : Warm and dry.  Head: Normocephalic and atraumatic  Eyes: Sclera and conjunctiva clear; pupils round and reactive to light; extraocular movements intact  Ears: External normal; canals clear; tympanic membranes normal  Oropharynx: Pink, supple. No suspicious lesions  Neck: Supple without thyromegaly, adenopathy  Lungs: Respirations unlabored; clear to auscultation bilaterally without wheeze, rales, rhonchi  CVS exam: normal rate and regular rhythm.  Neurologic: Alert and oriented; speech intact; face symmetrical; moves all extremities well; CNII-XII intact without focal deficit   Assessment:  1. Essential hypertension   2. Flu vaccine need   3. Menstrual migraine without status migrainosus, not intractable     Plan:  1. Uncontrolled; re-start Amlodipine 5 mg daily in addition to Lisinopril and Metoprolol; goal is for consistent readings between 120-130/80; 2. Flu shot given; 3. Toradol 30 mg IM given; she will keep headache journal and return next month; may need to consider daily preventive treatment; also given trial of Maxalt 10 mg- use as directed.  Return in about 4 weeks (around 03/06/2019) for Please bring headache journal.  Orders Placed This Encounter  Procedures  . Flu Vaccine QUAD 36+ mos IM    Requested Prescriptions   Signed Prescriptions Disp Refills  . amLODipine (NORVASC) 5 MG tablet 30 tablet 1    Sig: Take 1 tablet (5 mg total) by mouth daily.  . rizatriptan (MAXALT-MLT) 10 MG disintegrating tablet 10 tablet 0    Sig: Take 1 tablet (10 mg total) by mouth as needed for migraine. May repeat in 2 hours if needed

## 2019-02-19 HISTORY — PX: OTHER SURGICAL HISTORY: SHX169

## 2019-02-27 MED FILL — FARXIGA 10 MG TABLET: 10 | 30 days supply | Qty: 30 | Fill #1

## 2019-02-27 MED FILL — LANTUS SOLOSTAR 100 UNITS/M: 100 | 75 days supply | Qty: 15 | Fill #1

## 2019-03-01 ENCOUNTER — Encounter: Payer: Self-pay | Admitting: Family

## 2019-03-04 ENCOUNTER — Encounter: Payer: Self-pay | Admitting: Family

## 2019-03-04 ENCOUNTER — Other Ambulatory Visit (INDEPENDENT_AMBULATORY_CARE_PROVIDER_SITE_OTHER): Payer: 59

## 2019-03-04 ENCOUNTER — Ambulatory Visit (INDEPENDENT_AMBULATORY_CARE_PROVIDER_SITE_OTHER): Payer: 59 | Admitting: Family

## 2019-03-04 ENCOUNTER — Other Ambulatory Visit: Payer: Self-pay | Admitting: Family

## 2019-03-04 ENCOUNTER — Other Ambulatory Visit: Payer: Self-pay

## 2019-03-04 VITALS — BP 142/90 | HR 97 | Temp 98.8°F | Ht 64.0 in | Wt 223.8 lb

## 2019-03-04 DIAGNOSIS — T148XXA Other injury of unspecified body region, initial encounter: Secondary | ICD-10-CM | POA: Diagnosis not present

## 2019-03-04 DIAGNOSIS — L309 Dermatitis, unspecified: Secondary | ICD-10-CM

## 2019-03-04 DIAGNOSIS — I10 Essential (primary) hypertension: Secondary | ICD-10-CM

## 2019-03-04 DIAGNOSIS — N912 Amenorrhea, unspecified: Secondary | ICD-10-CM | POA: Diagnosis not present

## 2019-03-04 DIAGNOSIS — E1165 Type 2 diabetes mellitus with hyperglycemia: Secondary | ICD-10-CM

## 2019-03-04 LAB — CBC WITH DIFFERENTIAL/PLATELET
Basophils Absolute: 0 10*3/uL (ref 0.0–0.1)
Basophils Relative: 0.3 % (ref 0.0–3.0)
Eosinophils Absolute: 0.1 10*3/uL (ref 0.0–0.7)
Eosinophils Relative: 1.4 % (ref 0.0–5.0)
HCT: 44 % (ref 36.0–46.0)
Hemoglobin: 14.5 g/dL (ref 12.0–15.0)
Lymphocytes Relative: 23.1 % (ref 12.0–46.0)
Lymphs Abs: 1.5 10*3/uL (ref 0.7–4.0)
MCHC: 33.1 g/dL (ref 30.0–36.0)
MCV: 82.9 fl (ref 78.0–100.0)
Monocytes Absolute: 0.5 10*3/uL (ref 0.1–1.0)
Monocytes Relative: 7.3 % (ref 3.0–12.0)
Neutro Abs: 4.6 10*3/uL (ref 1.4–7.7)
Neutrophils Relative %: 67.9 % (ref 43.0–77.0)
Platelets: 344 10*3/uL (ref 150.0–400.0)
RBC: 5.3 Mil/uL — ABNORMAL HIGH (ref 3.87–5.11)
RDW: 14.8 % (ref 11.5–15.5)
WBC: 6.7 10*3/uL (ref 4.0–10.5)

## 2019-03-04 LAB — HCG, QUANTITATIVE, PREGNANCY: Quantitative HCG: <0.6 m[IU]/mL

## 2019-03-04 NOTE — Addendum Note (Signed)
Addended by: Sherlene Shams on: 03/04/2019 10:18 AM   Modules accepted: Orders

## 2019-03-04 NOTE — Progress Notes (Signed)
Jeanette Meyer is a 43 y.o. female with the following history as recorded in [employer]:  Patient Active Problem List   Diagnosis Date Noted  . Dyslipidemia 01/27/2019  . At risk for cardiovascular event 10/28/2018  . Abscess of pubic region 04/29/2018  . Essential hypertension 01/17/2018  . Uncontrolled type 2 diabetes mellitus ([place]) 01/17/2018  . H/O rectal polypectomy 01/17/2018  . Fibroid uterus 11/06/2017  . Abnormal uterine bleeding (AUB) 11/06/2017  . Abscess of groin, left 11/06/2017    Current Outpatient Medications  Medication Sig Dispense Refill  . amLODipine (NORVASC) 5 MG tablet Take 1 tablet (5 mg total) by mouth daily. 30 tablet 1  . atorvastatin (LIPITOR) 10 MG tablet Take 1 tablet (10 mg total) by mouth daily. 90 tablet 3  . Blood Glucose Monitoring Suppl (BLOOD GLUCOSE MONITOR SYSTEM) w/Device KIT Dispense Freestyle meter or per insurance coverage. Check glucose once daily - ICD 10- E11.65 1 each 0  . cetirizine (ZYRTEC) 10 MG tablet Take 10 mg by mouth daily.    . Continuous Blood Gluc Receiver (FREESTYLE LIBRE 14 DAY READER) DEVI 1 application by Does not apply route 4 (four) times a week. 1 Device 0  . dapagliflozin propanediol (FARXIGA) 10 MG TABS tablet Take 10 mg by mouth daily before breakfast. 30 tablet 6  . glucose blood test strip Check fasting blood sugar once daily. 150 each 12  . Insulin Glargine (LANTUS SOLOSTAR) 100 UNIT/ML Solostar Pen Inject 24 Units into the skin daily. 5 pen 5  . insulin lispro (HUMALOG KWIKPEN) 100 UNIT/ML KiwkPen Inject 0.08 mLs (8 Units total) into the skin 3 (three) times daily. (Patient taking differently: Inject 6-10 Units into the skin 3 (three) times daily. ) 15 mL 11  . lisinopril (ZESTRIL) 20 MG tablet Take 1 tablet (20 mg total) by mouth 2 (two) times daily. 180 tablet 1  . metFORMIN (GLUCOPHAGE-XR) 750 MG 24 hr tablet Take 1 tablet (750 mg total) by mouth 2 (two) times daily. 180 tablet 1  . metoprolol tartrate (LOPRESSOR)  25 MG tablet Take 1 tablet (25 mg total) by mouth 2 (two) times daily. 180 tablet 1  . mupirocin ointment (BACTROBAN) 2 % Use qd-bid 22 g 0  . rizatriptan (MAXALT-MLT) 10 MG disintegrating tablet Take 1 tablet (10 mg total) by mouth as needed for migraine. May repeat in 2 hours if needed 10 tablet 0   Current Facility-Administered Medications  Medication Dose Route Frequency Provider Last Rate Last Dose  . Insulin Pen Needle (NOVOFINE) 30 each  3 packet Subcutaneous PRN Shamleffer, Melanie Crazier, MD        Allergies: Bactrim [sulfamethoxazole-trimethoprim], Clarithromycin, Doxycycline, and Latex  Past Medical History:  Diagnosis Date  . Diabetes mellitus without complication ([place])   . Hypertension     Past Surgical History:  Procedure Laterality Date  . MYOMECTOMY    . RECTAL POLYPECTOMY      Family History  Problem Relation Age of Onset  . Diabetes Mother   . Hypertension Mother   . Diabetes Maternal Grandmother   . Cancer Maternal Grandmother   . Hypertension Maternal Grandmother     Social History   Tobacco Use  . Smoking status: Never Smoker  . Smokeless tobacco: Never Used  Substance Use Topics  . Alcohol use: Not Currently    Subjective:  Follow-up on chronic concerns:  1) Notes she has not taken her blood pressure medication today; however, since adding Amlodipine, her home numbers have been averaging 119-128/78-80; has  noticed improvement in her headaches as well. 2) Notes that her period is a week late- does not use any type of contraception; has never been pregnant; having increased breast tenderness and burping in the past week; has not taken home pregnancy test. 3) Originally scheduled appointment due to concerning bruise/ red area on back of right thigh- has been using cortisone and almost completely resolved.   Objective:  Vitals:   03/04/19 0931  BP: (!) 142/90  Pulse: 97  Temp: 98.8 F (37.1 C)  TempSrc: Oral  SpO2: 97%  Weight: 223 lb 12.8 oz  (101.5 kg)  Height: '5\' 4"'$  (1.626 m)    General: Well developed, well nourished, in no acute distress  Skin : Warm and dry. Localized area of dermatitis on back of right thigh Head: Normocephalic and atraumatic  Eyes: Sclera and conjunctiva clear; pupils round and reactive to light; extraocular movements intact  Ears: External normal; canals clear; tympanic membranes normal  Oropharynx: Pink, supple. No suspicious lesions  Neck: Supple without thyromegaly, adenopathy  Lungs: Respirations unlabored;  Neurologic: Alert and oriented; speech intact; face symmetrical; moves all extremities well; CNII-XII intact without focal deficit   Assessment:  1. Amenorrhea   2. Bruising   3. Essential hypertension   4. Dermatitis     Plan:  1. Check serum Hcg today; follow up to be determined. 2. Check CBC for reassurance; 3. Per patient, home readings have been well controlled; continue same medicaitons. 4. Continue OTC cortisone- area of concern is resolving.   No follow-ups on file.  Orders Placed This Encounter  Procedures  . hCG, serum, qualitative    Standing Status:   Future    Number of Occurrences:   1    Standing Expiration Date:   03/03/2020  . CBC w/Diff    Standing Status:   Future    Number of Occurrences:   1    Standing Expiration Date:   03/03/2020    Requested Prescriptions    No prescriptions requested or ordered in this encounter

## 2019-03-06 DIAGNOSIS — Z1159 Encounter for screening for other viral diseases: Secondary | ICD-10-CM | POA: Diagnosis not present

## 2019-03-10 ENCOUNTER — Ambulatory Visit: Payer: 59 | Admitting: Family

## 2019-03-10 ENCOUNTER — Other Ambulatory Visit: Payer: Self-pay | Admitting: Surgery

## 2019-03-10 DIAGNOSIS — L732 Hidradenitis suppurativa: Secondary | ICD-10-CM | POA: Diagnosis not present

## 2019-03-10 MED FILL — AMOX-CLAV 875-125 MG TABLET: 875-125 | 7 days supply | Qty: 14 | Fill #0

## 2019-03-10 MED FILL — oxyCODONE HCL 5 MG TABS: 5 | 5 days supply | Qty: 20 | Fill #0

## 2019-03-12 ENCOUNTER — Encounter: Payer: Self-pay | Admitting: Podiatry

## 2019-03-12 ENCOUNTER — Other Ambulatory Visit: Payer: Self-pay

## 2019-03-12 ENCOUNTER — Ambulatory Visit (INDEPENDENT_AMBULATORY_CARE_PROVIDER_SITE_OTHER): Payer: 59 | Admitting: Podiatry

## 2019-03-12 VITALS — BP 124/80 | HR 103

## 2019-03-12 DIAGNOSIS — M79675 Pain in left toe(s): Secondary | ICD-10-CM | POA: Diagnosis not present

## 2019-03-12 DIAGNOSIS — G8929 Other chronic pain: Secondary | ICD-10-CM

## 2019-03-12 DIAGNOSIS — L600 Ingrowing nail: Secondary | ICD-10-CM

## 2019-03-12 NOTE — Patient Instructions (Addendum)
Soak Instructions    THE DAY AFTER THE PROCEDURE  Place 1/4 cup of epsom salts in a quart of warm tap water.  Submerge your foot or feet with outer bandage intact for the initial soak; this will allow the bandage to become moist and wet for easy lift off.  Once you remove your bandage, continue to soak in the solution for 20 minutes.  This soak should be done twice a day.  Next, remove your foot or feet from solution, blot dry the affected area and cover.  You may use a band aid large enough to cover the area or use gauze and tape.  Apply other medications to the area as directed by the doctor such as polysporin neosporin.  IF YOUR SKIN BECOMES IRRITATED WHILE USING THESE INSTRUCTIONS, IT IS OKAY TO SWITCH TO  WHITE VINEGAR AND WATER. Or you may use antibacterial soap and water to keep the toe clean  Monitor for any signs/symptoms of infection. Call the office immediately if any occur or go directly to the emergency room. Call with any questions/concerns.   Ingrown Toenail An ingrown toenail occurs when the corner or sides of a toenail grow into the surrounding skin. This causes discomfort and pain. The big toe is most commonly affected, but any of the toes can be affected. If an ingrown toenail is not treated, it can become infected. What are the causes? This condition may be caused by:  Wearing shoes that are too small or tight.  An injury, such as stubbing your toe or having your toe stepped on.  Improper cutting or care of your toenails.  Having nail or foot abnormalities that were present from birth (congenital abnormalities), such as having a nail that is too big for your toe. What increases the risk? The following factors may make you more likely to develop ingrown toenails:  Age. Nails tend to get thicker with age, so ingrown nails are more common among older people.  Cutting your toenails incorrectly, such as cutting them very short or cutting them unevenly. An ingrown toenail  is more likely to get infected if you have:  Diabetes.  Blood flow (circulation) problems. What are the signs or symptoms? Symptoms of an ingrown toenail may include:  Pain, soreness, or tenderness.  Redness.  Swelling.  Hardening of the skin that surrounds the toenail. Signs that an ingrown toenail may be infected include:  Fluid or pus.  Symptoms that get worse instead of better. How is this diagnosed? An ingrown toenail may be diagnosed based on your medical history, your symptoms, and a physical exam. If you have fluid or blood coming from your toenail, a sample may be collected to test for the specific type of bacteria that is causing the infection. How is this treated? Treatment depends on how severe your ingrown toenail is. You may be able to care for your toenail at home.  If you have an infection, you may be prescribed antibiotic medicines.  If you have fluid or pus draining from your toenail, your health care provider may drain it.  If you have trouble walking, you may be given crutches to use.  If you have a severe or infected ingrown toenail, you may need a procedure to remove part or all of the nail. Follow these instructions at home: Foot care   Do not pick at your toenail or try to remove it yourself.  Soak your foot in warm, soapy water. Do this for 20 minutes, 3 times a   day, or as often as told by your health care provider. This helps to keep your toe clean and keep your skin soft.  Wear shoes that fit well and are not too tight. Your health care provider may recommend that you wear open-toed shoes while you heal.  Trim your toenails regularly and carefully. Cut your toenails straight across to prevent injury to the skin at the corners of the toenail. Do not cut your nails in a curved shape.  Keep your feet clean and dry to help prevent infection. Medicines  Take over-the-counter and prescription medicines only as told by your health care  provider.  If you were prescribed an antibiotic, take it as told by your health care provider. Do not stop taking the antibiotic even if you start to feel better. Activity  Return to your normal activities as told by your health care provider. Ask your health care provider what activities are safe for you.  Avoid activities that cause pain. General instructions  If your health care provider told you to use crutches to help you move around, use them as instructed.  Keep all follow-up visits as told by your health care provider. This is important. Contact a health care provider if:  You have more redness, swelling, pain, or other symptoms that do not improve with treatment.  You have fluid, blood, or pus coming from your toenail. Get help right away if:  You have a red streak on your skin that starts at your foot and spreads up your leg.  You have a fever. Summary  An ingrown toenail occurs when the corner or sides of a toenail grow into the surrounding skin. This causes discomfort and pain. The big toe is most commonly affected, but any of the toes can be affected.  If an ingrown toenail is not treated, it can become infected.  Fluid or pus draining from your toenail is a sign of infection. Your health care provider may need to drain it. You may be given antibiotics to treat the infection.  Trimming your toenails regularly and properly can help you prevent an ingrown toenail. This information is not intended to replace advice given to you by your health care provider. Make sure you discuss any questions you have with your health care provider. Document Released: 05/04/2000 Document Revised: 08/29/2018 Document Reviewed: 01/23/2017 Elsevier Patient Education  2020 [employer].

## 2019-03-13 NOTE — Progress Notes (Signed)
Subjective:   Patient ID: Jeanette Meyer, female   DOB: 43 y.o.   MRN: [id number]   HPI 43 year old female presents the office today for diabetic foot exam as well as for stinging sensation to her left big toenail, medial corner.  She states it hurts mostly after she works 12-hour shifts wearing shoes.  Denies any drainage or pus or any swelling.  She did cut the toenail which did help but it grows back.  Hurts mostly in the corner of the nail.  She has no other questions or concerns today.   Review of Systems  All other systems reviewed and are negative.  Past Medical History:  Diagnosis Date  . Diabetes mellitus without complication ([place])   . Hypertension     Past Surgical History:  Procedure Laterality Date  . MYOMECTOMY    . RECTAL POLYPECTOMY       Current Outpatient Medications:  .  amLODipine (NORVASC) 5 MG tablet, Take 1 tablet (5 mg total) by mouth daily., Disp: 30 tablet, Rfl: 1 .  atorvastatin (LIPITOR) 10 MG tablet, Take 1 tablet (10 mg total) by mouth daily., Disp: 90 tablet, Rfl: 3 .  Blood Glucose Monitoring Suppl (BLOOD GLUCOSE MONITOR SYSTEM) w/Device KIT, Dispense Freestyle meter or per insurance coverage. Check glucose once daily - ICD 10- E11.65, Disp: 1 each, Rfl: 0 .  cetirizine (ZYRTEC) 10 MG tablet, Take 10 mg by mouth daily., Disp: , Rfl:  .  Continuous Blood Gluc Receiver (FREESTYLE LIBRE 14 DAY READER) DEVI, 1 application by Does not apply route 4 (four) times a week., Disp: 1 Device, Rfl: 0 .  dapagliflozin propanediol (FARXIGA) 10 MG TABS tablet, Take 10 mg by mouth daily before breakfast., Disp: 30 tablet, Rfl: 6 .  glucose blood test strip, Check fasting blood sugar once daily., Disp: 150 each, Rfl: 12 .  Insulin Glargine (LANTUS SOLOSTAR) 100 UNIT/ML Solostar Pen, Inject 24 Units into the skin daily., Disp: 5 pen, Rfl: 5 .  insulin lispro (HUMALOG KWIKPEN) 100 UNIT/ML KiwkPen, Inject 0.08 mLs (8 Units total) into the skin 3 (three) times daily.  (Patient taking differently: Inject 6-10 Units into the skin 3 (three) times daily. ), Disp: 15 mL, Rfl: 11 .  lisinopril (ZESTRIL) 20 MG tablet, Take 1 tablet (20 mg total) by mouth 2 (two) times daily., Disp: 180 tablet, Rfl: 1 .  metFORMIN (GLUCOPHAGE-XR) 750 MG 24 hr tablet, Take 1 tablet (750 mg total) by mouth 2 (two) times daily., Disp: 180 tablet, Rfl: 1 .  metoprolol tartrate (LOPRESSOR) 25 MG tablet, Take 1 tablet (25 mg total) by mouth 2 (two) times daily., Disp: 180 tablet, Rfl: 1 .  mupirocin ointment (BACTROBAN) 2 %, Use qd-bid, Disp: 22 g, Rfl: 0 .  rizatriptan (MAXALT-MLT) 10 MG disintegrating tablet, Take 1 tablet (10 mg total) by mouth as needed for migraine. May repeat in 2 hours if needed, Disp: 10 tablet, Rfl: 0  Current Facility-Administered Medications:  .  Insulin Pen Needle (NOVOFINE) 30 each, 3 packet, Subcutaneous, PRN, Shamleffer, Melanie Crazier, MD  Allergies  Allergen Reactions  . Bactrim [Sulfamethoxazole-Trimethoprim] Diarrhea and Nausea And Vomiting  . Clarithromycin Anaphylaxis  . Doxycycline Anaphylaxis  . Latex           Objective:  Physical Exam  General: AAO x3, NAD  Dermatological: Mild incurvation present to the medial aspect of the left hallux toenail but distal aspect.  No drainage or pus.  No edema, erythema or clinical signs of infection.  There  are some hyperkeratotic tissue adjacent to the nail border.  Vascular: Dorsalis Pedis artery and Posterior Tibial artery pedal pulses are 2/4 bilateral with immedate capillary fill time. There is no pain with calf compression, swelling, warmth, erythema.   Neruologic: Grossly intact via light touch bilateral.   Musculoskeletal: No gross boney pedal deformities bilateral. No pain, crepitus, or limitation noted with foot and ankle range of motion bilateral. Muscular strength 5/5 in all groups tested bilateral.  Gait: Unassisted, Nonantalgic.       Assessment:   Mild ingrown toenail left hallux  medial nail border     Plan:  -Treatment options discussed including all alternatives, risks, and complications -Etiology of symptoms were discussed -We discussed partial nail avulsion however she wants to hold off on this today.  I did sharply debrided toenails without any complications after I used lidocaine cream to numb the area.  Recommend Epson salt soaks.  No bleeding occurred during the procedure and there is no signs of an abscess.  Monitor for any signs or symptoms of infection.  Trula Slade DPM

## 2019-03-17 MED FILL — FLUCONAZOLE 150 MG TABLET: 150 | 1 days supply | Qty: 3 | Fill #0

## 2019-04-06 ENCOUNTER — Other Ambulatory Visit: Payer: Self-pay | Admitting: Family

## 2019-04-06 MED FILL — FARXIGA 10 MG TABLET: 10 | 30 days supply | Qty: 30 | Fill #2

## 2019-04-06 MED FILL — AMLODIPINE BESYLATE 5 MG TA: 5 | 30 days supply | Qty: 30 | Fill #0

## 2019-04-24 ENCOUNTER — Other Ambulatory Visit: Payer: Self-pay

## 2019-04-28 ENCOUNTER — Ambulatory Visit (INDEPENDENT_AMBULATORY_CARE_PROVIDER_SITE_OTHER): Payer: 59 | Admitting: Internal Medicine

## 2019-04-28 ENCOUNTER — Encounter: Payer: Self-pay | Admitting: Internal Medicine

## 2019-04-28 VITALS — BP 128/68 | HR 99 | Temp 98.2°F | Ht 64.0 in | Wt 218.0 lb

## 2019-04-28 DIAGNOSIS — E1165 Type 2 diabetes mellitus with hyperglycemia: Secondary | ICD-10-CM

## 2019-04-28 LAB — BASIC METABOLIC PANEL
BUN: 10 mg/dL (ref 6–23)
CO2: 27 mEq/L (ref 19–32)
Calcium: 9.2 mg/dL (ref 8.4–10.5)
Chloride: 102 mEq/L (ref 96–112)
Creatinine, Ser: 1.13 mg/dL (ref 0.40–1.20)
GFR: 63.32 mL/min (ref >60.00)
Glucose, Bld: 154 mg/dL — ABNORMAL HIGH (ref 70–99)
Potassium: 3.9 mEq/L (ref 3.5–5.1)
Sodium: 137 mEq/L (ref 135–145)

## 2019-04-28 LAB — MICROALBUMIN / CREATININE URINE RATIO
Creatinine,U: 61.8 mg/dL
Microalb Creat Ratio: 1.1 mg/g (ref 0.0–30.0)
Microalb, Ur: <0.7 mg/dL (ref 0.0–1.9)

## 2019-04-28 LAB — POCT GLYCOSYLATED HEMOGLOBIN (HGB A1C): Hemoglobin A1C: 9.2 % — AB (ref 4.0–5.6)

## 2019-04-28 MED ORDER — INSULIN LISPRO PROT & LISPRO (75-25 MIX) 100 UNIT/ML KWIKPEN: 24.0000 [IU] | PEN_INJECTOR | Freq: Two times a day (BID) | SUBCUTANEOUS | 11 refills | Status: DC

## 2019-04-28 MED ORDER — FARXIGA 10 MG PO TABS: 10.0000 mg | ORAL_TABLET | Freq: Every day | ORAL | 3 refills | Status: DC

## 2019-04-28 MED ORDER — PEN NEEDLES 32G X 4 MM MISC: 1.0000 | Freq: Two times a day (BID) | 3 refills | Status: AC

## 2019-04-28 MED ORDER — METFORMIN HCL ER 750 MG PO TB24: 750.0000 mg | ORAL_TABLET | Freq: Every day | ORAL | 3 refills | Status: DC

## 2019-04-28 MED FILL — HUMALOG MIX 75-25 KWIKPEN: (75-25) 100 | 31 days supply | Qty: 15 | Fill #0

## 2019-04-28 MED FILL — UNIFINE PENTIPS 32GX5/32: 32G X 4 MM | 90 days supply | Qty: 200 | Fill #0

## 2019-04-28 MED FILL — METFORMIN HCL ER 750 MG TB2: 750 | 90 days supply | Qty: 90 | Fill #0

## 2019-04-28 MED FILL — UNIFINE PENTIPS 32GX5/32": 32G X 4 MM | 90 days supply | Qty: 200 | Fill #0

## 2019-04-28 MED FILL — FLUCONAZOLE 150 MG TABLET: 150 | 1 days supply | Qty: 3 | Fill #1

## 2019-04-28 NOTE — Patient Instructions (Signed)
-   Stop Lantus  - Stop Humalog  - START Humalog Mix 24 units with Breakfast and Supper  - Continue Metformin 750 mg XR daily  - Continue Farxiga 10 mg daily       HOW TO TREAT LOW BLOOD SUGARS (Blood sugar LESS THAN 70 MG/DL)  Please follow the RULE OF 15 for the treatment of hypoglycemia treatment (when your (blood sugars are less than 70 mg/dL)    STEP 1: Take 15 grams of carbohydrates when your blood sugar is low, which includes:   3-4 GLUCOSE TABS  OR  3-4 OZ OF JUICE OR REGULAR SODA OR  ONE TUBE OF GLUCOSE GEL     STEP 2: RECHECK blood sugar in 15 MINUTES STEP 3: If your blood sugar is still low at the 15 minute recheck --> then, go back to STEP 1 and treat AGAIN with another 15 grams of carbohydrates.

## 2019-04-28 NOTE — Progress Notes (Signed)
Name: Jeanette Meyer  Age/ Sex: 43 y.o., female   MRN/ DOB: [id number], Dec 23, 1975     PCP: Marrian Salvage, [place]   Reason for Endocrinology Evaluation: Type 2 Diabetes Mellitus  Initial Endocrine Consultative Visit: 03/07/18    PATIENT IDENTIFIER: Jeanette Meyer is a 43 y.o. female with a past medical history of HTN and DM. The patient has followed with Endocrinology clinic since 03/07/18 for consultative assistance with management of her diabetes.    DIABETIC HISTORY:  Jeanette Meyer was diagnosed with T2DM at age 66. She was initially on MDI insulin regimen ~ months. With lifestyle changes and weight loss she was able to come off insulin. She was switched to oral glycemic agents at the time. Over the years she has tried Trulitcity which caused palpitations, Januvia caused joint pains. She has also tried [place] but due to a single episode of UTI patient states this was discontinued but we restarted it 10/2018. Lantus was added in August, 2019 to her regimen . Her hemoglobin A1c was 12.2% on her presentation to our clinic   Prandial insulin started 02/2018  She is intolerant to BID dosing of metformin   SUBJECTIVE:   During the last visit (03/04/2019):A1c 9.5 %. We continued MDI regimen, reduced metformin and increased [place].    Today (04/28/2019): Jeanette Meyer is here for a follow up on her diabetes management.   She checks her blood sugars sporadically . The patient has not had hypoglycemic episodes since the last clinic visit. She continues to snack but has been cutting on sugar-sweetened beverages.Otherwise, the patient has not required any recent emergency interventions for hypoglycemia and has not had recent hospitalizations secondary to hyper or hypoglycemic episodes.      ROS: As per HPI and as detailed below: Review of Systems  Constitutional: Negative for fever and weight loss.  HENT: Positive for congestion. Negative for sore throat.   Respiratory: Negative  for cough and shortness of breath.   Cardiovascular: Positive for leg swelling. Negative for chest pain.  Gastrointestinal: Positive for constipation, heartburn and nausea. Negative for diarrhea.  Genitourinary: Negative for frequency.  Skin: Negative for itching and rash.  Endo/Heme/Allergies: Negative for polydipsia.       HOME DIABETES REGIMEN:  Lantus 24 units daily Metformin 750 XR mg daily  Humalog 6-10 units  Farxiga 10 mg daily     METER DOWNLOAD SUMMARY: Date range evaluated: 11/25-12/12/2018 Average Number Tests/Day = 0.4 Overall Mean FS Glucose = 193 Standard Deviation: 25  BG Ranges: Low = 167 High =229   Hypoglycemic Events/30 Days: BG < 50 = 0 Episodes of symptomatic severe hypoglycemia = 0   HISTORY:  Past Medical History:  Past Medical History:  Diagnosis Date  . Diabetes mellitus without complication ([place])   . Hypertension    Past Surgical History:  Past Surgical History:  Procedure Laterality Date  . MYOMECTOMY    . RECTAL POLYPECTOMY     Social History:  reports that she has never smoked. She has never used smokeless tobacco. She reports previous alcohol use. She reports that she does not use drugs. Family History:  Family History  Problem Relation Age of Onset  . Diabetes Mother   . Hypertension Mother   . Diabetes Maternal Grandmother   . Cancer Maternal Grandmother   . Hypertension Maternal Grandmother      HOME MEDICATIONS: Allergies as of 04/28/2019      Reactions   Bactrim [sulfamethoxazole-trimethoprim] Diarrhea, Nausea And Vomiting  Clarithromycin Anaphylaxis   Doxycycline Anaphylaxis   Latex       Medication List       Accurate as of April 28, 2019  9:05 AM. If you have any questions, ask your nurse or doctor.        STOP taking these medications   insulin lispro 100 UNIT/ML KiwkPen Commonly known as: HumaLOG KwikPen Stopped by: Dorita Sciara, MD   Lantus SoloStar 100 UNIT/ML Solostar Pen Generic drug:  Insulin Glargine Stopped by: Dorita Sciara, MD     TAKE these medications   amLODipine 5 MG tablet Commonly known as: NORVASC TAKE 1 TABLET (5 MG TOTAL) BY MOUTH DAILY.   atorvastatin 10 MG tablet Commonly known as: LIPITOR Take 1 tablet (10 mg total) by mouth daily.   Blood Glucose Monitor System w/Device Kit Dispense Freestyle meter or per insurance coverage. Check glucose once daily - ICD 10- E11.65   cetirizine 10 MG tablet Commonly known as: ZYRTEC Take 10 mg by mouth daily.   Farxiga 10 MG Tabs tablet Generic drug: dapagliflozin propanediol Take 10 mg by mouth daily before breakfast.   FreeStyle Libre 14 Day Reader Kerrin Mo 1 application by Does not apply route 4 (four) times a week.   glucose blood test strip Check fasting blood sugar once daily.   Insulin Lispro Prot & Lispro (75-25) 100 UNIT/ML Kwikpen Commonly known as: HumaLOG Mix 75/25 KwikPen Inject 24 Units into the skin 2 (two) times daily with a meal. Started by: Dorita Sciara, MD   lisinopril 20 MG tablet Commonly known as: ZESTRIL Take 1 tablet (20 mg total) by mouth 2 (two) times daily.   metFORMIN 750 MG 24 hr tablet Commonly known as: GLUCOPHAGE-XR Take 1 tablet (750 mg total) by mouth 2 (two) times daily.   metoprolol tartrate 25 MG tablet Commonly known as: LOPRESSOR Take 1 tablet (25 mg total) by mouth 2 (two) times daily.   mupirocin ointment 2 % Commonly known as: BACTROBAN Use qd-bid   Pen Needles 32G X 4 MM Misc 1 Device by Does not apply route 2 (two) times daily with a meal. Started by: Dorita Sciara, MD   rizatriptan 10 MG disintegrating tablet Commonly known as: Maxalt-MLT Take 1 tablet (10 mg total) by mouth as needed for migraine. May repeat in 2 hours if needed        OBJECTIVE:   Vital Signs: BP 128/68 (BP Location: Left Arm, Patient Position: Sitting, Cuff Size: Large)   Pulse 99   Temp 98.2 F (36.8 C)   Ht '5\' 4"'$  (1.626 m)   Wt 218 lb (98.9  kg)   SpO2 99%   BMI 37.42 kg/m   Wt Readings from Last 3 Encounters:  04/28/19 218 lb (98.9 kg)  03/04/19 223 lb 12.8 oz (101.5 kg)  02/06/19 219 lb 0.6 oz (99.4 kg)     Exam: General: Pt appears well and is in NAD  Neck: General: Supple without adenopathy. Thyroid: Thyroid size normal.  No goiter or nodules appreciated. No thyroid bruit.  Lungs: Clear with good BS bilat with no rales, rhonchi, or wheezes  Heart: RRR with normal S1 and S2 and no gallops; no murmurs; no rub  Abdomen: Normoactive bowel sounds, soft, nontender, without masses or organomegaly palpable  Extremities: No pretibial edema. No tremor.   Skin: Normal texture and temperature to palpation. No rash noted.mild Acanthosis nigricans around the neck  Neuro: MS is good with appropriate affect, pt is alert and Ox3  DM Foot Exam: 04/28/2019 The skin of the feet is intact without sores or ulcerations. The pedal pulses are 2+ on right and 2+ on left. The sensation is intact to a screening 5.07, 10 gram monofilament bilaterally    DATA REVIEWED:  Lab Results  Component Value Date   HGBA1C 9.2 (A) 04/28/2019   HGBA1C 9.5 (A) 01/27/2019   HGBA1C 10.0 (A) 10/27/2018   Lab Results  Component Value Date   LDLCALC 92 10/27/2018   CREATININE 0.63 10/27/2018   Results for SKYLA, CHAMPAGNE A "ANN" (MRN [id number]) as of 04/28/2019 15:37  Ref. Range 04/28/2019 08:55  Sodium Latest Ref Range: 135 - 145 mEq/L 137  Potassium Latest Ref Range: 3.5 - 5.1 mEq/L 3.9  Chloride Latest Ref Range: 96 - 112 mEq/L 102  CO2 Latest Ref Range: 19 - 32 mEq/L 27  Glucose Latest Ref Range: 70 - 99 mg/dL 154 (H)  BUN Latest Ref Range: 6 - 23 mg/dL 10  Creatinine Latest Ref Range: 0.40 - 1.20 mg/dL 1.13  Calcium Latest Ref Range: 8.4 - 10.5 mg/dL 9.2  GFR Latest Ref Range: >60.00 mL/min 63.32  MICROALB/CREAT RATIO Latest Ref Range: 0.0 - 30.0 mg/g 1.1    ASSESSMENT / PLAN / RECOMMENDATIONS:   1) Type 2 Diabetes Mellitus, Poorly  controlled, without complications - Most recent A1c of 9.2%. Goal A1c < 7.0%.     - Her glucose continues to be out of control despite gradual escalation of anti glycemic agents, this is either due to imperfect medication adherence or dietary indiscretions which she admits to or both.  - She works night shifts and she has agreed today to give insulin mix a try .  - We also discussed avoiding sugar-sweetened beverages and snacks, when possible.  - She is intolerant to GLp-1 agonists and DPP-4 inhibitors.  - BMP normal today    MEDICATIONS: Stop  Lantus Stop Humalog  Humalog Mix 24 units With Breakfast and Supper  Metformin '750mg'$  XR Once daily  Farxiga 10 mg daily   EDUCATION / INSTRUCTIONS: BG monitoring instructions: Patient is instructed to check her blood sugars 2 times a day, before breakfast and supper . Call [facility] if: BG persistently < 70 or > 300. I reviewed the Rule of 15 for the treatment of hypoglycemia in detail with the patient. Literature supplied.     2) Diabetic complications:  Eye: Does not have known diabetic retinopathy.  Neuro/ Feet: does not  have known diabetic peripheral neuropathy . Renal: Patient does not have known baseline CKD.  She is on an ACEI/ARB at present.   3) Lipids: No side effects to Lipitor.      F/U in 3 months    Signed electronically by: Mack Guise, MD  [facility] Endocrinology  [facility] [address] [place], [place] [address] Phone: [phone] FAX: [phone]   CC: Marrian Salvage, [place] [address] Phone: [phone]  Fax: [phone]  Return to Endocrinology clinic as below: Future Appointments  Date Time Provider [place]  07/16/2019  8:00 AM [facility] DIAG [place]OM[place] 1 GI-[facility] GI-[facility] CE  07/16/2019  8:10 AM GI-[facility] [place] 1 GI-BCGUS GI-BREAST CE  07/28/2019  8:10 AM Armstrong Creasy, Melanie Crazier, MD [facility] None

## 2019-04-30 ENCOUNTER — Encounter: Payer: Self-pay | Admitting: Family

## 2019-05-01 MED FILL — AMLODIPINE BESYLATE 5 MG TA: 5 | 30 days supply | Qty: 30 | Fill #1

## 2019-05-01 MED FILL — FARXIGA 10 MG TABLET: 10 | 30 days supply | Qty: 30 | Fill #3

## 2019-06-01 MED FILL — HUMALOG MIX 75-25 KWIKPEN: (75-25) 100 | 31 days supply | Qty: 15 | Fill #1

## 2019-06-08 ENCOUNTER — Encounter: Payer: Self-pay | Admitting: Family

## 2019-06-09 ENCOUNTER — Ambulatory Visit (INDEPENDENT_AMBULATORY_CARE_PROVIDER_SITE_OTHER): Payer: 59 | Admitting: Family

## 2019-06-09 DIAGNOSIS — H1032 Unspecified acute conjunctivitis, left eye: Secondary | ICD-10-CM | POA: Diagnosis not present

## 2019-06-09 MED ORDER — MOXIFLOXACIN HCL 0.5 % OP SOLN: 1.0000 [drp] | Freq: Three times a day (TID) | OPHTHALMIC | 0 refills | Status: DC

## 2019-06-09 MED FILL — MOXIFLOXACIN HCL 0.5 % SOLN: 0.5 | 7 days supply | Qty: 3 | Fill #0

## 2019-06-09 NOTE — Telephone Encounter (Signed)
Sending this to the front. I can not see the request. I just wanted her to get scheduled before the appt is taken.   RE: Pink eye no other sx.

## 2019-06-09 NOTE — Progress Notes (Signed)
Jeanette Meyer is a 44 y.o. female with the following history as recorded in EpicCare:  Patient Active Problem List   Diagnosis Date Noted  . Dyslipidemia 01/27/2019  . At risk for cardiovascular event 10/28/2018  . Abscess of pubic region 04/29/2018  . Essential hypertension 01/17/2018  . Uncontrolled type 2 diabetes mellitus ([place]) 01/17/2018  . H/O rectal polypectomy 01/17/2018  . Fibroid uterus 11/06/2017  . Abnormal uterine bleeding (AUB) 11/06/2017  . Abscess of groin, left 11/06/2017    Current Outpatient Medications  Medication Sig Dispense Refill  . amLODipine (NORVASC) 5 MG tablet TAKE 1 TABLET (5 MG TOTAL) BY MOUTH DAILY. 30 tablet 1  . atorvastatin (LIPITOR) 10 MG tablet Take 1 tablet (10 mg total) by mouth daily. 90 tablet 3  . Blood Glucose Monitoring Suppl (BLOOD GLUCOSE MONITOR SYSTEM) w/Device KIT Dispense Freestyle meter or per insurance coverage. Check glucose once daily - ICD 10- E11.65 1 each 0  . cetirizine (ZYRTEC) 10 MG tablet Take 10 mg by mouth daily.    . dapagliflozin propanediol (FARXIGA) 10 MG TABS tablet Take 10 mg by mouth daily before breakfast. 90 tablet 3  . glucose blood test strip Check fasting blood sugar once daily. 150 each 12  . Insulin Lispro Prot & Lispro (HUMALOG MIX 75/25 KWIKPEN) (75-25) 100 UNIT/ML Kwikpen Inject 24 Units into the skin 2 (two) times daily with a meal. 15 mL 11  . Insulin Pen Needle (PEN NEEDLES) 32G X 4 MM MISC 1 Device by Does not apply route 2 (two) times daily with a meal. 200 each 3  . lisinopril (ZESTRIL) 20 MG tablet Take 1 tablet (20 mg total) by mouth 2 (two) times daily. 180 tablet 1  . metFORMIN (GLUCOPHAGE-XR) 750 MG 24 hr tablet Take 1 tablet (750 mg total) by mouth daily with breakfast. 90 tablet 3  . metoprolol tartrate (LOPRESSOR) 25 MG tablet Take 1 tablet (25 mg total) by mouth 2 (two) times daily. 180 tablet 1  . moxifloxacin (VIGAMOX) 0.5 % ophthalmic solution Place 1 drop into both eyes 3 (three) times  daily. 3 mL 0  . mupirocin ointment (BACTROBAN) 2 % Use qd-bid 22 g 0  . rizatriptan (MAXALT-MLT) 10 MG disintegrating tablet Take 1 tablet (10 mg total) by mouth as needed for migraine. May repeat in 2 hours if needed 10 tablet 0   No current facility-administered medications for this visit.    Allergies: Bactrim [sulfamethoxazole-trimethoprim], Clarithromycin, Doxycycline, and Latex  Past Medical History:  Diagnosis Date  . Diabetes mellitus without complication ([place])   . Hypertension     Past Surgical History:  Procedure Laterality Date  . MYOMECTOMY    . RECTAL POLYPECTOMY      Family History  Problem Relation Age of Onset  . Diabetes Mother   . Hypertension Mother   . Diabetes Maternal Grandmother   . Cancer Maternal Grandmother   . Hypertension Maternal Grandmother     Social History   Tobacco Use  . Smoking status: Never Smoker  . Smokeless tobacco: Never Used  Substance Use Topics  . Alcohol use: Not Currently    Subjective:    I connected with Jeanette Meyer on 06/09/19 at 12:40 PM EST by a video enabled telemedicine application and verified that I am speaking with the correct person using two identifiers.   I discussed the limitations of evaluation and management by telemedicine and the availability of in person appointments. The patient expressed understanding and agreed to proceed. Provider  in office/ patient is at home; provider and patient are only 2 people on video call.   Complaining of pink eye; woke up yesterday and again today with discharge in her left eye; no pain but does have sensation of "cotton in my eye." Does wear contact lenses; no vision changes. Has used Visine this morning;     Objective:  There were no vitals filed for this visit.  General: Well developed, well nourished, in no acute distress  Skin : Warm and dry.  Head: Normocephalic and atraumatic  Eyes: Sclera and conjunctiva clear; pupils round and reactive to light; extraocular  movements intact  Lungs: Respirations unlabored;  Neurologic: Alert and oriented; speech intact;   Assessment:  1. Acute conjunctivitis of left eye, unspecified acute conjunctivitis type     Plan:  Rx for Vigamox use 1gtt tid; needs to get new contact lenses; follow up with her eye doctor if the symptoms persist.   No follow-ups on file.  No orders of the defined types were placed in this encounter.   Requested Prescriptions   Signed Prescriptions Disp Refills  . moxifloxacin (VIGAMOX) 0.5 % ophthalmic solution 3 mL 0    Sig: Place 1 drop into both eyes 3 (three) times daily.

## 2019-06-09 NOTE — Telephone Encounter (Signed)
Patient scheduled for today at 12:40pm.

## 2019-06-22 ENCOUNTER — Other Ambulatory Visit: Payer: Self-pay | Admitting: Family

## 2019-06-22 MED FILL — METOPROLOL TARTRATE 25 MG T: 25 | 90 days supply | Qty: 180 | Fill #0

## 2019-06-22 MED FILL — AMLODIPINE BESYLATE 5 MG TA: 5 | 30 days supply | Qty: 30 | Fill #0

## 2019-06-22 MED FILL — FARXIGA 10 MG TABLET: 10 | 30 days supply | Qty: 30 | Fill #4

## 2019-06-22 MED FILL — LISINOPRIL 20 MG TABLET: 20 | 90 days supply | Qty: 180 | Fill #0

## 2019-06-22 MED FILL — ATORVASTATIN 10 MG TABLET: 10 | 90 days supply | Qty: 90 | Fill #1

## 2019-07-16 ENCOUNTER — Other Ambulatory Visit: Payer: Self-pay | Admitting: Family

## 2019-07-16 ENCOUNTER — Ambulatory Visit: Payer: 59

## 2019-07-16 ENCOUNTER — Ambulatory Visit
Admission: RE | Admit: 2019-07-16 | Discharge: 2019-07-16 | Disposition: A | Discharge status: Home / Self Care | Payer: 59 | Source: Ambulatory Visit | Attending: Family | Admitting: Family

## 2019-07-16 ENCOUNTER — Other Ambulatory Visit: Payer: Self-pay

## 2019-07-16 DIAGNOSIS — R928 Other abnormal and inconclusive findings on diagnostic imaging of breast: Secondary | ICD-10-CM | POA: Diagnosis not present

## 2019-07-16 DIAGNOSIS — N6489 Other specified disorders of breast: Secondary | ICD-10-CM

## 2019-07-23 MED FILL — FARXIGA 10 MG TABLET: 10 | 30 days supply | Qty: 30 | Fill #5

## 2019-07-28 ENCOUNTER — Ambulatory Visit: Payer: 59 | Admitting: Internal Medicine

## 2019-07-29 MED FILL — HUMALOG MIX 75-25 KWIKPEN: (75-25) 100 | 31 days supply | Qty: 15 | Fill #2

## 2019-08-24 ENCOUNTER — Encounter: Payer: Self-pay | Admitting: Family

## 2019-08-26 ENCOUNTER — Other Ambulatory Visit: Payer: Self-pay | Admitting: Family

## 2019-08-26 MED ORDER — RIZATRIPTAN BENZOATE 10 MG PO TABS: 10.0000 mg | ORAL_TABLET | ORAL | 1 refills | Status: DC | PRN

## 2019-08-26 MED FILL — RIZATRIPTAN BENZOATE 10 MG: 10 | 20 days supply | Qty: 10 | Fill #0

## 2019-08-27 DIAGNOSIS — M25561 Pain in right knee: Secondary | ICD-10-CM | POA: Diagnosis not present

## 2019-08-27 DIAGNOSIS — M25511 Pain in right shoulder: Secondary | ICD-10-CM | POA: Diagnosis not present

## 2019-09-01 MED FILL — HUMALOG MIX 75-25 KWIKPEN: (75-25) 100 | 31 days supply | Qty: 15 | Fill #3

## 2019-10-05 ENCOUNTER — Encounter: Payer: Self-pay | Admitting: Family

## 2019-10-05 ENCOUNTER — Other Ambulatory Visit: Payer: Self-pay | Admitting: Family

## 2019-10-05 MED FILL — METOPROLOL TARTRATE 25 MG T: 25 | 90 days supply | Qty: 180 | Fill #1

## 2019-10-05 MED FILL — LISINOPRIL 20 MG TABLET: 20 | 90 days supply | Qty: 180 | Fill #0

## 2019-10-05 MED FILL — HUMALOG MIX 75-25 KWIKPEN: (75-25) 100 | 31 days supply | Qty: 15 | Fill #4

## 2019-10-05 MED FILL — FARXIGA 10 MG TABLET: 10 | 30 days supply | Qty: 30 | Fill #6

## 2019-11-10 MED FILL — HUMALOG MIX 75-25 KWIKPEN: (75-25) 100 | 31 days supply | Qty: 15 | Fill #5

## 2019-12-11 ENCOUNTER — Other Ambulatory Visit: Payer: Self-pay | Admitting: Family

## 2019-12-11 MED FILL — AMLODIPINE BESYLATE 5 MG TA: 5 | 30 days supply | Qty: 30 | Fill #1

## 2019-12-11 MED FILL — RIZATRIPTAN BENZOATE 10 MG: 10 | 20 days supply | Qty: 10 | Fill #1

## 2019-12-11 MED FILL — FARXIGA 10 MG TABLET: 10 | 90 days supply | Qty: 90 | Fill #0

## 2019-12-11 MED FILL — HUMALOG MIX 75-25 KWIKPEN: (75-25) 100 | 31 days supply | Qty: 15 | Fill #6

## 2019-12-16 MED FILL — LISINOPRIL 20 MG TABLET: 20 | 90 days supply | Qty: 180 | Fill #0

## 2019-12-16 MED FILL — METOPROLOL TARTRATE 25 MG T: 25 | 90 days supply | Qty: 180 | Fill #0

## 2020-01-12 ENCOUNTER — Ambulatory Visit (INDEPENDENT_AMBULATORY_CARE_PROVIDER_SITE_OTHER): Payer: 59 | Admitting: Internal Medicine

## 2020-01-12 ENCOUNTER — Other Ambulatory Visit: Payer: Self-pay

## 2020-01-12 ENCOUNTER — Encounter: Payer: Self-pay | Admitting: Internal Medicine

## 2020-01-12 VITALS — BP 140/84 | HR 90 | Ht 64.0 in | Wt 222.6 lb

## 2020-01-12 DIAGNOSIS — E1165 Type 2 diabetes mellitus with hyperglycemia: Secondary | ICD-10-CM

## 2020-01-12 LAB — POCT GLYCOSYLATED HEMOGLOBIN (HGB A1C): Hemoglobin A1C: 9.5 % — AB (ref 4.0–5.6)

## 2020-01-12 LAB — POCT GLUCOSE (DEVICE FOR HOME USE): Glucose Fasting, POC: 230 mg/dL — AB (ref 70–99)

## 2020-01-12 NOTE — Patient Instructions (Signed)
-   Continue  Humalog Mix 24 units with Breakfast and Supper  - Continue Metformin 750 mg XR daily  - Continue Farxiga 10 mg daily   - Check out the insulin pump ( V-GO)     HOW TO TREAT LOW BLOOD SUGARS (Blood sugar LESS THAN 70 MG/DL)  Please follow the RULE OF 15 for the treatment of hypoglycemia treatment (when your (blood sugars are less than 70 mg/dL)    STEP 1: Take 15 grams of carbohydrates when your blood sugar is low, which includes:   3-4 GLUCOSE TABS  OR  3-4 OZ OF JUICE OR REGULAR SODA OR  ONE TUBE OF GLUCOSE GEL     STEP 2: RECHECK blood sugar in 15 MINUTES STEP 3: If your blood sugar is still low at the 15 minute recheck --> then, go back to STEP 1 and treat AGAIN with another 15 grams of carbohydrates.

## 2020-01-12 NOTE — Progress Notes (Signed)
Name: Jeanette Meyer  Age/ Sex: 44 y.o., female   MRN/ DOB: [id number], Dec 26, 1975     PCP: Marrian Salvage, [place]   Reason for Endocrinology Evaluation: Type 2 Diabetes Mellitus  Initial Endocrine Consultative Visit: 03/07/18    PATIENT IDENTIFIER: Jeanette Meyer is a 44 y.o. female with a past medical history of HTN and DM. The patient has followed with Endocrinology clinic since 03/07/18 for consultative assistance with management of her diabetes.    DIABETIC HISTORY:  Jeanette Meyer was diagnosed with T2DM at age 35. She was initially on MDI insulin regimen ~ months. With lifestyle changes and weight loss she was able to come off insulin. She was switched to oral glycemic agents at the time. Over the years she has tried Trulitcity which caused palpitations, Januvia caused joint pains. She has also tried [place] but due to a single episode of UTI patient states this was discontinued but we restarted it 10/2018. Lantus was added in August, 2019 to her regimen . Her hemoglobin A1c was 12.2% on her presentation to our clinic   Prandial insulin started 02/2018  She is intolerant to BID dosing of metformin   Switched MDI regimen to insulin mix 04/2019  SUBJECTIVE:   During the last visit (04/28/2019):A1c 9.2%. we switched MDI regimen to an insulin mix due to hardship with taking 4 injections a day. We continued  metformin and [place].    Today (01/12/2020): Jeanette Meyer is here for a follow up on her diabetes management. She has not been to our office in 8 months.   She checks her blood sugars sporadically . The patient has had hypoglycemic episodes since the last clinic visit. She endorses skipping at supper.    She likes the insulin Mix rather than MDI regimen.   Hours of work 7p - 7 Am   HOME DIABETES REGIMEN:  Humalog Mix 24 units BID  Metformin 750 XR mg daily  Farxiga 10 mg daily     METER DOWNLOAD SUMMARY: did not bring     HISTORY:  Past Medical  History:  Past Medical History:  Diagnosis Date  . Diabetes mellitus without complication ([place])   . Hypertension    Past Surgical History:  Past Surgical History:  Procedure Laterality Date  . MYOMECTOMY    . RECTAL POLYPECTOMY     Social History:  reports that she has never smoked. She has never used smokeless tobacco. She reports previous alcohol use. She reports that she does not use drugs. Family History:  Family History  Problem Relation Age of Onset  . Diabetes Mother   . Hypertension Mother   . Diabetes Maternal Grandmother   . Cancer Maternal Grandmother   . Hypertension Maternal Grandmother      HOME MEDICATIONS: Allergies as of 01/12/2020      Reactions   Bactrim [sulfamethoxazole-trimethoprim] Diarrhea, Nausea And Vomiting   Clarithromycin Anaphylaxis   Doxycycline Anaphylaxis   Latex       Medication List       Accurate as of January 12, 2020  9:09 AM. If you have any questions, ask your nurse or doctor.        STOP taking these medications   atorvastatin 10 MG tablet Commonly known as: LIPITOR Stopped by: Dorita Sciara, MD     TAKE these medications   amLODipine 5 MG tablet Commonly known as: NORVASC TAKE 1 TABLET (5 MG TOTAL) BY MOUTH DAILY.   Blood Glucose Monitor  System w/Device Kit Dispense Freestyle meter or per insurance coverage. Check glucose once daily - ICD 10- E11.65   cetirizine 10 MG tablet Commonly known as: ZYRTEC Take 10 mg by mouth daily.   Farxiga 10 MG Tabs tablet Generic drug: dapagliflozin propanediol Take 10 mg by mouth daily before breakfast.   glucose blood test strip Check fasting blood sugar once daily.   Insulin Lispro Prot & Lispro (75-25) 100 UNIT/ML Kwikpen Commonly known as: HumaLOG Mix 75/25 KwikPen Inject 24 Units into the skin 2 (two) times daily with a meal.   lisinopril 20 MG tablet Commonly known as: ZESTRIL TAKE 1 TABLET BY MOUTH 2 TIMES DAILY.   metFORMIN 750 MG 24 hr tablet Commonly  known as: GLUCOPHAGE-XR Take 1 tablet (750 mg total) by mouth daily with breakfast.   metoprolol tartrate 25 MG tablet Commonly known as: LOPRESSOR TAKE 1 TABLET BY MOUTH 2 TIMES DAILY.   moxifloxacin 0.5 % ophthalmic solution Commonly known as: Vigamox Place 1 drop into both eyes 3 (three) times daily.   mupirocin ointment 2 % Commonly known as: BACTROBAN Use qd-bid   Pen Needles 32G X 4 MM Misc 1 Device by Does not apply route 2 (two) times daily with a meal.   rizatriptan 10 MG tablet Commonly known as: Maxalt Take 1 tablet (10 mg total) by mouth as needed for migraine. May repeat in 2 hours if needed        OBJECTIVE:   Vital Signs: BP 140/84 (BP Location: Left Arm, Patient Position: Sitting, Cuff Size: Normal)   Pulse 90   Ht _0  (1.626 m)   Wt 222 lb 9.6 oz (101 kg)   SpO2 98%   BMI 38.21 kg/m   Wt Readings from Last 3 Encounters:  01/12/20 222 lb 9.6 oz (101 kg)  04/28/19 218 lb (98.9 kg)  03/04/19 223 lb 12.8 oz (101.5 kg)     Exam: General: Pt appears well and is in NAD  Neck: General: Supple without adenopathy. Thyroid: Thyroid size normal.  No goiter or nodules appreciated. No thyroid bruit.  Lungs: Clear with good BS bilat with no rales, rhonchi, or wheezes  Heart: RRR with normal S1 and S2 and no gallops; no murmurs; no rub  Abdomen: Normoactive bowel sounds, soft, nontender, without masses or organomegaly palpable  Extremities: No pretibial edema. No tremor.   Skin: Normal texture and temperature to palpation. No rash noted.mild Acanthosis nigricans around the neck  Neuro: MS is good with appropriate affect, pt is alert and Ox3    DM Foot Exam: 04/28/2019 The skin of the feet is intact without sores or ulcerations. The pedal pulses are 2+ on right and 2+ on left. The sensation is intact to a screening 5.07, 10 gram monofilament bilaterally    DATA REVIEWED:  Lab Results  Component Value Date   HGBA1C 9.5 (A) 01/12/2020   HGBA1C 9.2 (A)  04/28/2019   HGBA1C 9.5 (A) 01/27/2019   Lab Results  Component Value Date   MICROALBUR <0.7 04/28/2019   [place] 92 10/27/2018   CREATININE 1.13 04/28/2019   Results for Jeanette Meyer, Jeanette A "ANN" (MRN [id number]) as of 04/28/2019 15:37  Ref. Range 04/28/2019 08:55  Sodium Latest Ref Range: 135 - 145 mEq/L 137  Potassium Latest Ref Range: 3.5 - 5.1 mEq/L 3.9  Chloride Latest Ref Range: 96 - 112 mEq/L 102  CO2 Latest Ref Range: 19 - 32 mEq/L 27  Glucose Latest Ref Range: 70 - 99 mg/dL 154 (H)  BUN Latest Ref Range: 6 - 23 mg/dL 10  Creatinine Latest Ref Range: 0.40 - 1.20 mg/dL 1.13  Calcium Latest Ref Range: 8.4 - 10.5 mg/dL 9.2  GFR Latest Ref Range: >60.00 mL/min 63.32  MICROALB/CREAT RATIO Latest Ref Range: 0.0 - 30.0 mg/g 1.1   In-office BG 230 mg/dL ( had biscuit and cranberry juice this morning )   ASSESSMENT / PLAN / RECOMMENDATIONS:   1) Type 2 Diabetes Mellitus, Poorly controlled, without complications - Most recent A1c of 9.5%. Goal A1c < 7.0%.      - Poorly controlled diabetes due to dietary indiscretions and imperfect medication adherence.   - We switched her from an MDI regimen to an insulin Mix due to difficulty with managing daily injections while working a 3rd shift but that does not seem to have made a difference as her A1c continues to be high.  - We again emphasized importance of low carb diet and avoiding sugar sweetened beverages  ( this morning had juice)  - She is intolerant to GLp-1 agonists and DPP-4 inhibitors.  - I have discussed V-Go as an option . She will look in to this and let me know if she would like to start on this.  - I am unable to make any adjustments to her regimen at this time due to lack of glucose data. She also tells me she took 26 units of insulin with supper but again she endorses symptoms of hypoglycemia on the way here ( no glucose reading at the time) - She was advised to take medications daily, check glucose twice daily and bring the  meter on next visit    MEDICATIONS:  Humalog Mix 24 units With Breakfast and Supper  Metformin 726m XR Once daily  Farxiga 10 mg daily   EDUCATION / INSTRUCTIONS: BG monitoring instructions: Patient is instructed to check her blood sugars 2 times a day, before breakfast and supper . [facility] if: BG persistently < 70  I reviewed the Rule of 15 for the treatment of hypoglycemia in detail with the patient. Literature supplied.     2) Diabetic complications:  Eye: Does not have known diabetic retinopathy.  Neuro/ Feet: does not  have known diabetic peripheral neuropathy . Renal: Patient does not have known baseline CKD.  She is on an ACEI/ARB at present.      F/U in 3 months    Signed electronically by: AMack Guise MD  [facility]  [address] [place] [address]Phone: [phone]: [phone]  CC: MMarrian Salvage [place]Phone: [phone] Fax: [phone] Return to Endocrinology clinic as below: Future Appointments  Date Time Provider [place] 01/15/2020  8:00 AM GI-[facility]G DIAG TOMO 1 GI-BCGMM GI-BREAST CE  02/24/2020  9:00 AM Cirigliano, Vito V, DO LBGI-HP [facility]PCGastro

## 2020-01-14 DIAGNOSIS — Z124 Encounter for screening for malignant neoplasm of cervix: Secondary | ICD-10-CM | POA: Diagnosis not present

## 2020-01-14 DIAGNOSIS — Z6837 Body mass index (BMI) 37.0-37.9, adult: Secondary | ICD-10-CM | POA: Diagnosis not present

## 2020-01-14 DIAGNOSIS — Z01411 Encounter for gynecological examination (general) (routine) with abnormal findings: Secondary | ICD-10-CM | POA: Diagnosis not present

## 2020-01-14 DIAGNOSIS — B373 Candidiasis of vulva and vagina: Secondary | ICD-10-CM | POA: Diagnosis not present

## 2020-01-14 DIAGNOSIS — K644 Residual hemorrhoidal skin tags: Secondary | ICD-10-CM | POA: Diagnosis not present

## 2020-01-14 MED FILL — TERCONAZOLE 0.4% VAG CREAM: 0.4 | 7 days supply | Qty: 45 | Fill #0

## 2020-01-15 ENCOUNTER — Other Ambulatory Visit: Payer: Self-pay | Admitting: Family

## 2020-01-15 ENCOUNTER — Ambulatory Visit
Admission: RE | Admit: 2020-01-15 | Discharge: 2020-01-15 | Disposition: A | Discharge status: Home / Self Care | Payer: 59 | Source: Ambulatory Visit | Attending: Family | Admitting: Family

## 2020-01-15 ENCOUNTER — Other Ambulatory Visit: Payer: Self-pay

## 2020-01-15 DIAGNOSIS — N6321 Unspecified lump in the left breast, upper outer quadrant: Secondary | ICD-10-CM | POA: Diagnosis not present

## 2020-01-15 DIAGNOSIS — N6489 Other specified disorders of breast: Secondary | ICD-10-CM

## 2020-01-27 MED FILL — HUMALOG MIX 75-25 KWIKPEN: (75-25) 100 | 31 days supply | Qty: 15 | Fill #7

## 2020-02-24 ENCOUNTER — Ambulatory Visit (INDEPENDENT_AMBULATORY_CARE_PROVIDER_SITE_OTHER): Payer: 59 | Admitting: Gastroenterology

## 2020-02-24 ENCOUNTER — Other Ambulatory Visit: Payer: Self-pay | Admitting: Gastroenterology

## 2020-02-24 ENCOUNTER — Encounter: Payer: Self-pay | Admitting: Gastroenterology

## 2020-02-24 VITALS — BP 136/84 | HR 84 | Ht 64.0 in | Wt 219.4 lb

## 2020-02-24 DIAGNOSIS — K219 Gastro-esophageal reflux disease without esophagitis: Secondary | ICD-10-CM

## 2020-02-24 DIAGNOSIS — R110 Nausea: Secondary | ICD-10-CM

## 2020-02-24 DIAGNOSIS — K5900 Constipation, unspecified: Secondary | ICD-10-CM | POA: Diagnosis not present

## 2020-02-24 DIAGNOSIS — Z86010 Personal history of colonic polyps: Secondary | ICD-10-CM

## 2020-02-24 DIAGNOSIS — K6289 Other specified diseases of anus and rectum: Secondary | ICD-10-CM | POA: Diagnosis not present

## 2020-02-24 MED ORDER — CLENPIQ 10-3.5-12 MG-GM -GM/160ML PO SOLN: 1.0000 | ORAL | 0 refills | Status: DC

## 2020-02-24 MED FILL — METOPROLOL TARTRATE 25 MG T: 25 | 90 days supply | Qty: 180 | Fill #0

## 2020-02-24 MED FILL — METFORMIN HCL ER 750 MG TB2: 750 | 90 days supply | Qty: 90 | Fill #0

## 2020-02-24 MED FILL — CLENPIQ 10-3.5-12 MG-GM -GM: 10-3.5-12 M | 30 days supply | Qty: 320 | Fill #0

## 2020-02-24 NOTE — Patient Instructions (Addendum)
If you are age 44 or older, your body mass index should be between 23-30. Your Body mass index is 37.66 kg/m. If this is out of the aforementioned range listed, please consider follow up with your Primary Care Provider.  If you are age 85 or younger, your body mass index should be between 19-25. Your Body mass index is 37.66 kg/m. If this is out of the aformentioned range listed, please consider follow up with your Primary Care Provider.   You have been scheduled for an endoscopy and colonoscopy. Please follow the written instructions given to you at your visit today. Please pick up your prep supplies at the pharmacy within the next 1-3 days. If you use inhalers (even only as needed), please bring them with you on the day of your procedure.  We have sent the following medications to your pharmacy for you to pick up at your convenience: Clenpiq  It was a pleasure to see you today!  Vito Cirigliano, D.O.

## 2020-02-24 NOTE — Progress Notes (Signed)
Chief Complaint: Rectal pain, GERD, colon cancer screening   Referring Provider:     Marrian Salvage, FNP   HPI:     Jeanette Meyer is a 44 y.o. female with a history of diabetes, HTN, dyslipidemia, uterine fibroids, referred to the [facility]astroenterology Clinic for evaluation of rectal pain x4 months.  She reports having colonoscopy approx 2004 and notable for rectal polyp and referred to surgeon to remove. This was in [place] and no medical records for review. No colonoscopy since then.   Now with 4 month history of intermittent sharp, rectal pain. Sxs can last all day. Has had 3 episodes in 4 months. But sxs last all day long as dull ache, and not there the following day.  Symptoms do not stop her from her daily activities.  Also with bothersome skin tag vs hemorrhoid with pruritis. No hematochezia or melena. No dyschezia. Does have intemrittent constipation. Will use stool softener intermittently, but usually diarrhea with this. Will intead treat with hot ginger tea with efficacy. Also occasional incomplete evacuation.   Does have reflux sxs- HB, regurgitation, and intemrittent nausea without emesis. Recently started OTC Omeprazole 20 mg/day with resolutoin of sxs. No dysphagia. No previous EGD.    For her diabetes, she follows with Dr. Elenora Gamma [facility] in the Endocrinology Clinic, last seen on 01/12/2020.  A1c 9.5%.  No recent abdominal imaging for review.    Past Medical History:  Diagnosis Date  . Diabetes mellitus without complication ([place])   . Fibroid   . Hypertension      Past Surgical History:  Procedure Laterality Date  . boil  02/2019   [facility]  . COLONOSCOPY     2002-2004  . MYOMECTOMY    . RECTAL POLYPECTOMY     2002-2004   Family History  Problem Relation Age of Onset  . Diabetes Mother   . Hypertension Mother   . Diabetes Maternal Grandmother   . Cancer Maternal Grandmother   . Hypertension Maternal Grandmother   .  Liver cancer Maternal Grandmother   . Crohn's disease Maternal Uncle   . Colon cancer Neg Hx   . Esophageal cancer Neg Hx    Social History   Tobacco Use  . Smoking status: Never Smoker  . Smokeless tobacco: Never Used  Vaping Use  . Vaping Use: Never used  Substance Use Topics  . Alcohol use: Not Currently  . Drug use: Never   Current Outpatient Medications  Medication Sig Dispense Refill  . amLODipine (NORVASC) 5 MG tablet TAKE 1 TABLET (5 MG TOTAL) BY MOUTH DAILY. 30 tablet 1  . Blood Glucose Monitoring Suppl (BLOOD GLUCOSE MONITOR SYSTEM) w/Device KIT Dispense Freestyle meter or per insurance coverage. Check glucose once daily - ICD 10- E11.65 (Patient taking differently: Dispense Freestyle meter or per insurance coverage. Check glucose once daily - ICD 10- E11.65  Dario) 1 each 0  . cetirizine (ZYRTEC) 10 MG tablet Take 10 mg by mouth daily.    . dapagliflozin propanediol (FARXIGA) 10 MG TABS tablet Take 10 mg by mouth daily before breakfast. 90 tablet 3  . ELDERBERRY PO Take by mouth daily. Uses pill and syrup    . glucose blood test strip Check fasting blood sugar once daily. 150 each 12  . Insulin Lispro Prot & Lispro (HUMALOG MIX 75/25 KWIKPEN) (75-25) 100 UNIT/ML Kwikpen Inject 24 Units into the skin 2 (two) times daily with a  meal. 15 mL 11  . Insulin Pen Needle (PEN NEEDLES) 32G X 4 MM MISC 1 Device by Does not apply route 2 (two) times daily with a meal. 200 each 3  . lisinopril (ZESTRIL) 20 MG tablet TAKE 1 TABLET BY MOUTH 2 TIMES DAILY. 180 tablet 0  . metFORMIN (GLUCOPHAGE-XR) 750 MG 24 hr tablet Take 1 tablet (750 mg total) by mouth daily with breakfast. 90 tablet 3  . metoprolol tartrate (LOPRESSOR) 25 MG tablet TAKE 1 TABLET BY MOUTH 2 TIMES DAILY. 180 tablet 1  . Multiple Vitamin (MULTIVITAMIN) tablet Take 1 tablet by mouth daily.    . Probiotic Product (PROBIOTIC PO) Take by mouth daily.    . rizatriptan (MAXALT) 10 MG tablet Take 1 tablet (10 mg total) by mouth  as needed for migraine. May repeat in 2 hours if needed 10 tablet 1   No current facility-administered medications for this visit.   Allergies  Allergen Reactions  . Bactrim [Sulfamethoxazole-Trimethoprim] Diarrhea and Nausea And Vomiting  . Clarithromycin Anaphylaxis  . Doxycycline Anaphylaxis  . Latex      Review of Systems: All systems reviewed and negative except where noted in HPI.     Physical Exam:    Wt Readings from Last 3 Encounters:  02/24/20 219 lb 6 oz (99.5 kg)  01/12/20 222 lb 9.6 oz (101 kg)  04/28/19 218 lb (98.9 kg)    BP 136/84   Pulse 84   Ht $R'5\' 4"'Wc$  (1.626 m)   Wt 219 lb 6 oz (99.5 kg)   BMI 37.66 kg/m  Constitutional:  Pleasant, in no acute distress. Psychiatric: Normal mood and affect. Behavior is normal. EENT: Pupils normal.  Conjunctivae are normal. No scleral icterus. Neck supple. No cervical LAD. Cardiovascular: Normal rate, regular rhythm. No edema Pulmonary/chest: Effort normal and breath sounds normal. No wheezing, rales or rhonchi. Abdominal: Soft, nondistended, nontender. Bowel sounds active throughout. There are no masses palpable. No hepatomegaly. Neurological: Alert and oriented to person place and time. Skin: Skin is warm and dry. No rashes noted. Rectal: Exam deferred by patient to time of colonoscopy.    ASSESSMENT AND PLAN;   1) Rectal pain 2) Rectal pruritus 3) Constipation -Colonoscopy with extended 2-day prep -If clinically significant hemorrhoids, can plan for hemorrhoid banding at a future date -Continue adequate hydration -High-fiber diet  4) GERD 5) Nausea without emesis -EGD to evaluate for mucosal/luminal pathology along with objective evidence of reflux changes, LES laxity, hiatal hernia -Resume current antireflux medications -Discussed overlap of uncontrolled diabetes and GI symptoms -Depending on EGD results, could consider GES in the future  6) History of colon polyp -Colonoscopy as above for ongoing polyp  surveillance  The indications, risks, and benefits of EGD and colonoscopy were explained to the patient in detail. Risks include but are not limited to bleeding, perforation, adverse reaction to medications, and cardiopulmonary compromise. Sequelae include but are not limited to the possibility of surgery, hositalization, and mortality. The patient verbalized understanding and wished to proceed. All questions answered, referred to scheduler and bowel prep ordered. Further recommendations pending results of the exam.    Lavena Bullion, DO, FACG  02/24/2020, 9:31 AM   Marrian Salvage,*

## 2020-03-02 ENCOUNTER — Other Ambulatory Visit (HOSPITAL_BASED_OUTPATIENT_CLINIC_OR_DEPARTMENT_OTHER): Payer: Self-pay | Admitting: Obstetrics & Gynecology

## 2020-03-02 MED FILL — FLUCONAZOLE 150 MG TABS: 150 | 1 days supply | Qty: 1 | Fill #0

## 2020-03-03 ENCOUNTER — Encounter: Payer: Self-pay | Admitting: Gastroenterology

## 2020-03-03 ENCOUNTER — Other Ambulatory Visit: Payer: Self-pay | Admitting: Family

## 2020-03-03 MED FILL — FLUCONAZOLE 150 MG TABS: 150 | 1 days supply | Qty: 1 | Fill #0

## 2020-03-04 ENCOUNTER — Other Ambulatory Visit: Payer: Self-pay | Admitting: Family

## 2020-03-04 MED ORDER — AMLODIPINE BESYLATE 5 MG PO TABS: 5.0000 mg | ORAL_TABLET | Freq: Every day | ORAL | 1 refills | Status: DC

## 2020-03-04 MED FILL — AMLODIPINE BESYLATE 5 MG TA: 5 | 30 days supply | Qty: 30 | Fill #0

## 2020-03-04 NOTE — Telephone Encounter (Signed)
Reset to pof.Marland KitchenJohny Chess

## 2020-03-04 NOTE — Addendum Note (Signed)
Addended by: Earnstine Regal on: 03/04/2020 03:45 PM   Modules accepted: Orders

## 2020-03-08 MED FILL — FLUCONAZOLE 150 MG TABS: 150 | 1 days supply | Qty: 1 | Fill #1

## 2020-03-14 MED FILL — HUMALOG MIX 75-25 KWIKPEN: (75-25) 100 | 31 days supply | Qty: 15 | Fill #8

## 2020-03-15 ENCOUNTER — Telehealth: Payer: Self-pay | Admitting: *Deleted

## 2020-03-15 NOTE — Telephone Encounter (Signed)
noted 

## 2020-03-15 NOTE — Telephone Encounter (Signed)
Pt states she has been vaccinated.

## 2020-03-15 NOTE — Telephone Encounter (Signed)
Covid vaccination record not in pt's chart.  LMOM to call us back to see if she has been vaccinated

## 2020-03-16 ENCOUNTER — Encounter: Payer: Self-pay | Admitting: Gastroenterology

## 2020-03-16 ENCOUNTER — Ambulatory Visit (AMBULATORY_SURGERY_CENTER): Payer: 59 | Admitting: Gastroenterology

## 2020-03-16 ENCOUNTER — Other Ambulatory Visit: Payer: Self-pay

## 2020-03-16 VITALS — BP 131/88 | HR 84 | Temp 98.4°F | Resp 13 | Ht 64.0 in | Wt 219.0 lb

## 2020-03-16 DIAGNOSIS — K2950 Unspecified chronic gastritis without bleeding: Secondary | ICD-10-CM | POA: Diagnosis not present

## 2020-03-16 DIAGNOSIS — Z86010 Personal history of colonic polyps: Secondary | ICD-10-CM

## 2020-03-16 DIAGNOSIS — Z1211 Encounter for screening for malignant neoplasm of colon: Secondary | ICD-10-CM | POA: Diagnosis not present

## 2020-03-16 DIAGNOSIS — K644 Residual hemorrhoidal skin tags: Secondary | ICD-10-CM

## 2020-03-16 DIAGNOSIS — K219 Gastro-esophageal reflux disease without esophagitis: Secondary | ICD-10-CM | POA: Diagnosis not present

## 2020-03-16 DIAGNOSIS — K6289 Other specified diseases of anus and rectum: Secondary | ICD-10-CM | POA: Diagnosis not present

## 2020-03-16 DIAGNOSIS — K317 Polyp of stomach and duodenum: Secondary | ICD-10-CM

## 2020-03-16 DIAGNOSIS — K2970 Gastritis, unspecified, without bleeding: Secondary | ICD-10-CM

## 2020-03-16 DIAGNOSIS — D125 Benign neoplasm of sigmoid colon: Secondary | ICD-10-CM | POA: Diagnosis not present

## 2020-03-16 DIAGNOSIS — K634 Enteroptosis: Secondary | ICD-10-CM | POA: Diagnosis not present

## 2020-03-16 MED ORDER — SODIUM CHLORIDE 0.9 % IV SOLN: 500.0000 mL | INTRAVENOUS | Status: DC

## 2020-03-16 NOTE — Progress Notes (Signed)
Called to room to assist during endoscopic procedure.  Patient ID and intended procedure confirmed with present staff. Received instructions for my participation in the procedure from the performing physician.  

## 2020-03-16 NOTE — Op Note (Signed)
[name] Procedure Date: 03/16/2020 8:30 AM MRN: [id number] Endoscopist: Gerrit Heck , MD Age: 44 Referring MD:  Date of Birth: 09-23-1975 Gender: Female Account #: [id number] Procedure:                Upper GI endoscopy Indications:              Suspected esophageal reflux, Nausea Medicines:                Monitored Anesthesia Care Procedure:                Pre-Anesthesia Assessment:                           - Prior to the procedure, a History and Physical                            was performed, and patient medications and                            allergies were reviewed. The patient's tolerance of                            previous anesthesia was also reviewed. The risks                            and benefits of the procedure and the sedation                            options and risks were discussed with the patient.                            All questions were answered, and informed consent                            was obtained. Prior Anticoagulants: The patient has                            taken no previous anticoagulant or antiplatelet                            agents. ASA Grade Assessment: II - A patient with                            mild systemic disease. After reviewing the risks                            and benefits, the patient was deemed in                            satisfactory condition to undergo the procedure.                           After obtaining informed consent, the endoscope was  passed under direct vision. Throughout the                            procedure, the patient's blood pressure, pulse, and                            oxygen saturations were monitored continuously. The                            Endoscope was introduced through the mouth, and                            advanced to the second part of duodenum. The upper                            GI endoscopy was  accomplished without difficulty.                            The patient tolerated the procedure well. Scope In: Scope Out: Findings:                 A single area of ectopic gastric mucosa was found                            in the upper third of the esophagus.                           The middle third of the esophagus and lower third                            of the esophagus were normal.                           The Z-line was regular and was found 36 cm from the                            incisors.                           A few small sessile polyps with no stigmata of                            recent bleeding were found in the gastric fundus                            and in the gastric body. A few of these polyps were                            removed with a cold biopsy forceps. Resection and                            retrieval were complete. Estimated blood loss was  minimal.                           The mucosa was otherwise normal in the entire                            examined stomach. Biopsies were taken with a cold                            forceps for Helicobacter pylori testing. Estimated                            blood loss was minimal.                           The examined duodenum was normal. Complications:            No immediate complications. Estimated Blood Loss:     Estimated blood loss was minimal. Impression:               - Ectopic gastric mucosa in the upper third of the                            esophagus.                           - Normal middle third of esophagus and lower third                            of esophagus.                           - Z-line regular, 36 cm from the incisors.                           - A few gastric polyps. Resected and retrieved.                           - Normal mucosa was found in the entire stomach.                            Biopsied.                           - Normal examined  duodenum. Recommendation:           - Patient has a contact number available for                            emergencies. The signs and symptoms of potential                            delayed complications were discussed with the                            patient. Return to normal activities tomorrow.  Written discharge instructions were provided to the                            patient.                           - Resume previous diet.                           - Continue present medications.                           - Await pathology results.                           - Perform a colonoscopy today. Gerrit Heck, MD 03/16/2020 9:23:59 AM

## 2020-03-16 NOTE — Progress Notes (Signed)
A/ox3, pleased with MAC, report to RN 

## 2020-03-16 NOTE — Patient Instructions (Signed)
YOU HAD AN ENDOSCOPIC PROCEDURE TODAY AT [facility]:   Refer to the procedure report that was given to you for any specific questions about what was found during the examination.  If the procedure report does not answer your questions, please call your gastroenterologist to clarify.  If you requested that your care partner not be given the details of your procedure findings, then the procedure report has been included in a sealed envelope for you to review at your convenience later.  YOU SHOULD EXPECT: Some feelings of bloating in the abdomen. Passage of more gas than usual.  Walking can help get rid of the air that was put into your GI tract during the procedure and reduce the bloating. If you had a lower endoscopy (such as a colonoscopy or flexible sigmoidoscopy) you may notice spotting of blood in your stool or on the toilet paper. If you underwent a bowel prep for your procedure, you may not have a normal bowel movement for a few days.  Please Note:  You might notice some irritation and congestion in your nose or some drainage.  This is from the oxygen used during your procedure.  There is no need for concern and it should clear up in a day or so.  SYMPTOMS TO REPORT IMMEDIATELY:   Following lower endoscopy (colonoscopy or flexible sigmoidoscopy):  Excessive amounts of blood in the stool  Significant tenderness or worsening of abdominal pains  Swelling of the abdomen that is new, acute  Fever of 100F or higher   Following upper endoscopy (EGD)  Vomiting of blood or coffee ground material  New chest pain or pain under the shoulder blades  Painful or persistently difficult swallowing  New shortness of breath  Fever of 100F or higher  Black, tarry-looking stools  For urgent or emergent issues, a gastroenterologist can be reached at any hour by calling [phone]. Do not use MyChart messaging for urgent concerns.    DIET:  We do recommend a small meal at first, but  then you may proceed to your regular diet.  Drink plenty of fluids but you should avoid alcoholic beverages for 24 hours.  ACTIVITY:  You should plan to take it easy for the rest of today and you should NOT DRIVE or use heavy machinery until tomorrow (because of the sedation medicines used during the test).    FOLLOW UP: Our staff will call the number listed on your records 48-72 hours following your procedure to check on you and address any questions or concerns that you may have regarding the information given to you following your procedure. If we do not reach you, we will leave a message.  We will attempt to reach you two times.  During this call, we will ask if you have developed any symptoms of COVID 19. If you develop any symptoms (ie: fever, flu-like symptoms, shortness of breath, cough etc.) before then, please call [phone].  If you test positive for Covid 19 in the 2 weeks post procedure, please call and report this information to [place].    If any biopsies were taken you will be contacted by phone or by letter within the next 1-3 weeks.  Please call us at [phone] if you have not heard about the biopsies in 3 weeks.    SIGNATURES/CONFIDENTIALITY: You and/or your care partner have signed paperwork which will be entered into your electronic medical record.  These signatures attest to the fact that that the information above on  your After Visit Summary has been reviewed and is understood.  Full responsibility of the confidentiality of this discharge information lies with you and/or your care-partner.   Continue your normal medications  Please read over handout about polyps  Await pathology

## 2020-03-16 NOTE — Op Note (Signed)
[facility] Patient Name: Jeanette Meyer Procedure Date: 03/16/2020 8:28 AM MRN: [id number] Endoscopist: Gerrit Heck , MD Age: 44 Referring MD:  Date of Birth: 1976-04-02 Gender: Female Account #: [id number] Procedure:                Colonoscopy Indications:              Surveillance: Personal history of colonic polyps                            (unknown histology) on last colonoscopy more than                            10 years ago                           Last colonoscopy was at outside facility in                            approximatley 2004. Medicines:                Monitored Anesthesia Care Procedure:                Pre-Anesthesia Assessment:                           - Prior to the procedure, a History and Physical                            was performed, and patient medications and                            allergies were reviewed. The patient's tolerance of                            previous anesthesia was also reviewed. The risks                            and benefits of the procedure and the sedation                            options and risks were discussed with the patient.                            All questions were answered, and informed consent                            was obtained. Prior Anticoagulants: The patient has                            taken no previous anticoagulant or antiplatelet                            agents. ASA Grade Assessment: II - A patient with  mild systemic disease. After reviewing the risks                            and benefits, the patient was deemed in                            satisfactory condition to undergo the procedure.                           After obtaining informed consent, the colonoscope                            was passed under direct vision. Throughout the                            procedure, the patient's blood pressure, pulse, and                            oxygen  saturations were monitored continuously. The                            Colonoscope was introduced through the anus and                            advanced to the the cecum, identified by                            appendiceal orifice and ileocecal valve. The                            colonoscopy was performed without difficulty. The                            patient tolerated the procedure well. The quality                            of the bowel preparation was good. The ileocecal                            valve, appendiceal orifice, and rectum were                            photographed. Scope In: 8:57:38 AM Scope Out: 9:17:37 AM Scope Withdrawal Time: 0 hours 15 minutes 39 seconds  Total Procedure Duration: 0 hours 19 minutes 59 seconds  Findings:                 Skin tags were found on perianal exam.                           Three sessile polyps were found in the sigmoid                            colon. The polyps were 3 to 5 mm in size. These  polyps were removed with a cold snare. Resection                            and retrieval were complete. Estimated blood loss                            was minimal.                           An area of nodular mucosa was noted on                            retroflexion. Biopsies were taken with a cold                            forceps for histology. Estimated blood loss was                            minimal. Complications:            No immediate complications. Estimated Blood Loss:     Estimated blood loss was minimal. Impression:               - Perianal skin tags found on perianal exam.                           - Three 3 to 5 mm polyps in the sigmoid colon,                            removed with a cold snare. Resected and retrieved.                           - Nodular mucosa in the distal rectum noted on                            retroflexion. Suspect benign hypertrophied anal                             papillae, but area biopsied to rule out dysplasia,                            AIN. Recommendation:           - Patient has a contact number available for                            emergencies. The signs and symptoms of potential                            delayed complications were discussed with the                            patient. Return to normal activities tomorrow.  Written discharge instructions were provided to the                            patient.                           - Resume previous diet.                           - Continue present medications.                           - Await pathology results.                           - Repeat colonoscopy for surveillance based on                            pathology results.                           - Return to GI office PRN. Gerrit Heck, MD 03/16/2020 9:31:25 AM

## 2020-03-18 ENCOUNTER — Telehealth: Payer: Self-pay

## 2020-03-18 NOTE — Telephone Encounter (Signed)
°  Follow up Call-  Call back number 03/16/2020  Post procedure Call Back phone  # [phone]  Permission to leave phone message Yes     Patient questions:  Do you have a fever, pain , or abdominal swelling? No. Pain Score  0 *  Have you tolerated food without any problems? Yes.    Have you been able to return to your normal activities? Yes.    Do you have any questions about your discharge instructions: Diet   No. Medications  No. Follow up visit  No.  Do you have questions or concerns about your Care? No.  Actions: * If pain score is 4 or above: No action needed, pain <4. 1. Have you developed a fever since your procedure? no  2.   Have you had an respiratory symptoms (SOB or cough) since your procedure? no  3.   Have you tested positive for COVID 19 since your procedure no  4.   Have you had any family members/close contacts diagnosed with the COVID 19 since your procedure?  no   If yes to any of these questions please route to Joylene John, RN and Joella Prince, RN

## 2020-03-24 ENCOUNTER — Encounter: Payer: Self-pay | Admitting: Gastroenterology

## 2020-04-13 ENCOUNTER — Ambulatory Visit: Payer: 59 | Admitting: Internal Medicine

## 2020-04-26 MED FILL — HUMALOG MIX 75-25 KWIKPEN: (75-25) 100 | 31 days supply | Qty: 15 | Fill #9

## 2020-04-28 ENCOUNTER — Ambulatory Visit: Payer: Self-pay | Admitting: General Surgery

## 2020-04-28 DIAGNOSIS — K644 Residual hemorrhoidal skin tags: Secondary | ICD-10-CM | POA: Diagnosis not present

## 2020-04-28 NOTE — H&P (Signed)
The patient is a 44 year old female who presents with hemorrhoids. 4. 4. LV 1 who presents to the office for evaluation of an anal skin tag. She states that this is been present for many years. She reports ongoing issues with constipation, which require straining. On approximately a weekly basis. She denies any rectal bleeding. She complains of chronic skin irritation as the reason she would like her skin tag removed.   Problem List/Past Medical Jeanette Ruff, MD; 50/01/3266 3:58 PM) YEAST INFECTION (B37.9) HIDRADENITIS (L73.2) ANAL SKIN TAG (K64.4) POSTOP CHECK (T24)  Past Surgical History Jeanette Ruff, MD; 58/0/9983 3:58 PM) Hemorrhoidectomy  Diagnostic Studies History Jeanette Ruff, MD; 38/06/5051 3:58 PM) Colonoscopy >10 years ago Mammogram within last year Pap Smear 1-5 years ago  Allergies (Jeanette Meyer, [place]; 04/28/2020 3:42 PM) Latex Bactrim *ANTI-INFECTIVE AGENTS - MISC.* Doxycycline Hyclate *TETRACYCLINES* Clarithromycin *MACROLIDES* Anaphylaxis. Sulfamethoxazole-Trimethoprim *ANTI-INFECTIVE AGENTS - MISC.* Diarrhea, Vomiting, Nausea. Allergies Reconciled  Medication History (Jeanette Meyer, [place]; 04/28/2020 3:42 PM) ZyrTEC (10MG  Tablet, Oral) Active. HumaLOG KwikPen (100UNIT/ML Soln Pen-inj, Subcutaneous) Active. Lantus SoloStar (100UNIT/ML Soln Pen-inj, Subcutaneous) Active. Lisinopril (20MG  Tablet, Oral) Active. Metoprolol Tartrate (25MG  Tablet, Oral) Active. Farxiga (5MG  Tablet, Oral) Active. Fluconazole (150MG  Tablet, Oral) Active. Medications Reconciled  Social History Jeanette Ruff, MD; 97/10/7339 3:58 PM) Alcohol use Occasional alcohol use. Caffeine use Carbonated beverages, Coffee, Tea. No drug use Tobacco use Never smoker.  Family History Jeanette Ruff, MD; 93/11/9022 3:58 PM) Arthritis Sister. Diabetes Mellitus Brother, Mother. Hypertension Brother, Mother. Migraine Headache Brother. Thyroid problems  Sister.  Pregnancy / Birth History Jeanette Ruff, MD; [birth year]31 3:58 PM) Age at menarche 38 years. Contraceptive History Depo-provera, Oral contraceptives. Gravida 0 Para 0 Regular periods  Other Problems Jeanette Ruff, MD; 99/06/4266 3:58 PM) Back Pain Diabetes Mellitus Gastroesophageal Reflux Disease Hemorrhoids High blood pressure     Review of Systems Jeanette Ruff MD; 34/05/9620 3:58 PM) General Not Present- Appetite Loss, Chills, Fatigue, Fever, Night Sweats, Weight Gain and Weight Loss. Skin Present- Dryness. Not Present- Change in Wart/Mole, Hives, Jaundice, New Lesions, Non-Healing Wounds, Rash and Ulcer. HEENT Present- Nose Bleed, Seasonal Allergies, Sinus Pain and Wears glasses/contact lenses. Not Present- Earache, Hearing Loss, Hoarseness, Oral Ulcers, Ringing in the Ears, Sore Throat, Visual Disturbances and Yellow Eyes. Respiratory Not Present- Bloody sputum, Chronic Cough, Difficulty Breathing, Snoring and Wheezing. Cardiovascular Not Present- Chest Pain, Difficulty Breathing Lying Down, Leg Cramps, Palpitations, Rapid Heart Rate, Shortness of Breath and Swelling of Extremities. Gastrointestinal Present- Constipation, Hemorrhoids and Indigestion. Not Present- Abdominal Pain, Bloating, Bloody Stool, Change in Bowel Habits, Chronic diarrhea, Difficulty Swallowing, Excessive gas, Gets full quickly at meals, Nausea, Rectal Pain and Vomiting. Female Genitourinary Not Present- Frequency, Nocturia, Painful Urination, Pelvic Pain and Urgency. Musculoskeletal Present- Back Pain and Joint Pain. Not Present- Joint Stiffness, Muscle Pain, Muscle Weakness and Swelling of Extremities. Neurological Present- Headaches. Not Present- Decreased Memory, Fainting, Numbness, Seizures, Tingling, Tremor, Trouble walking and Weakness. Psychiatric Not Present- Anxiety, Bipolar, Change in Sleep Pattern, Depression, Fearful and Frequent crying. Endocrine Not Present- Cold Intolerance,  Excessive Hunger, Hair Changes, Heat Intolerance, Hot flashes and New Diabetes. Hematology Not Present- Blood Thinners, Easy Bruising, Excessive bleeding, Gland problems, HIV and Persistent Infections.  Vitals (Jeanette Meyer RMA; 04/28/2020 3:42 PM) 04/28/2020 3:42 PM Weight: 220.8 lb Height: 64in Body Surface Area: 2.04 m Body Mass Index: 37.9 kg/m  Temp.: 79F  Pulse: 92 (Regular)  BP: 128/84(Sitting, Left Arm, Standard)        Physical Exam Jeanette Ruff MD;  04/28/2020 3:55 PM)  General Mental Status-Alert. General Appearance-Cooperative.  Rectal Anorectal Exam External - Note: Large, chronically inflamed posterior skin tag.    Assessment & Plan Jeanette Ruff MD; 32/01/9241 3:57 PM)  ANAL SKIN TAG (K64.4) Impression: 44 year old female who presents to the office for evaluation of an anal skin tag. It has been present for many years. On exam today, this appears to be arising from the left posterior anal canal. It is chronically inflamed and partially ulcerated. I have recommended excision. She has a smaller anal skin tag in the anterior region. I recommended leaving alone. She is fine with this. She would like to proceed with excision. We discussed risk of recurrence as well as risk of bleeding and pain.

## 2020-04-29 ENCOUNTER — Other Ambulatory Visit (HOSPITAL_COMMUNITY): Payer: Self-pay | Admitting: Obstetrics & Gynecology

## 2020-04-29 DIAGNOSIS — L293 Anogenital pruritus, unspecified: Secondary | ICD-10-CM | POA: Diagnosis not present

## 2020-04-29 DIAGNOSIS — B373 Candidiasis of vulva and vagina: Secondary | ICD-10-CM | POA: Diagnosis not present

## 2020-04-29 DIAGNOSIS — N898 Other specified noninflammatory disorders of vagina: Secondary | ICD-10-CM | POA: Diagnosis not present

## 2020-04-29 MED FILL — AMLODIPINE BESYLATE 5 MG TA: 5 | 30 days supply | Qty: 30 | Fill #0

## 2020-04-29 MED FILL — FLUCONAZOLE 150 MG TABS: 150 | 30 days supply | Qty: 6 | Fill #0

## 2020-05-24 ENCOUNTER — Encounter: Payer: Self-pay | Admitting: Internal Medicine

## 2020-05-24 ENCOUNTER — Other Ambulatory Visit (HOSPITAL_BASED_OUTPATIENT_CLINIC_OR_DEPARTMENT_OTHER): Payer: Self-pay | Admitting: Internal Medicine

## 2020-05-24 ENCOUNTER — Ambulatory Visit: Payer: 59 | Admitting: Internal Medicine

## 2020-05-24 ENCOUNTER — Other Ambulatory Visit: Payer: Self-pay | Admitting: Internal Medicine

## 2020-05-24 ENCOUNTER — Other Ambulatory Visit: Payer: Self-pay

## 2020-05-24 ENCOUNTER — Ambulatory Visit: Payer: 59 | Attending: Internal Medicine

## 2020-05-24 VITALS — BP 140/86 | HR 88 | Ht 64.0 in | Wt 216.4 lb

## 2020-05-24 DIAGNOSIS — E1165 Type 2 diabetes mellitus with hyperglycemia: Secondary | ICD-10-CM | POA: Diagnosis not present

## 2020-05-24 DIAGNOSIS — Z23 Encounter for immunization: Secondary | ICD-10-CM

## 2020-05-24 LAB — POCT GLYCOSYLATED HEMOGLOBIN (HGB A1C): Hemoglobin A1C: 9.1 % — AB (ref 4.0–5.6)

## 2020-05-24 LAB — GLUCOSE, POCT (MANUAL RESULT ENTRY): POC Glucose: 226 mg/dl — AB (ref 70–99)

## 2020-05-24 MED ORDER — OZEMPIC (0.25 OR 0.5 MG/DOSE) 2 MG/1.5ML ~~LOC~~ SOPN: 0.5000 mg | PEN_INJECTOR | SUBCUTANEOUS | 6 refills | Status: DC

## 2020-05-24 MED FILL — FARXIGA 10 MG TABLET: 10 | 90 days supply | Qty: 90 | Fill #0

## 2020-05-24 MED FILL — FLUCONAZOLE 150 MG TABS: 150 | 30 days supply | Qty: 6 | Fill #0

## 2020-05-24 MED FILL — LISINOPRIL 20 MG TABS: 20 | 90 days supply | Qty: 180 | Fill #0

## 2020-05-24 MED FILL — OZEMPIC 0.25 OR 0.5 MG/DOSE: 2 | 84 days supply | Qty: 5 | Fill #0

## 2020-05-24 MED FILL — METOPROLOL TARTRATE 25 MG T: 25 | 90 days supply | Qty: 180 | Fill #1

## 2020-05-24 NOTE — Patient Instructions (Signed)
-   Continue  Humalog Mix 24 units with Breakfast and Supper  - Continue Metformin 750 mg XR daily  - Continue Farxiga 10 mg daily  - Start Ozempic 0.25 mg weekly for 6 weeks, then increase to 0.5 mg weekly     HOW TO TREAT LOW BLOOD SUGARS (Blood sugar LESS THAN 70 MG/DL)  Please follow the RULE OF 15 for the treatment of hypoglycemia treatment (when your (blood sugars are less than 70 mg/dL)    STEP 1: Take 15 grams of carbohydrates when your blood sugar is low, which includes:   3-4 GLUCOSE TABS  OR  3-4 OZ OF JUICE OR REGULAR SODA OR  ONE TUBE OF GLUCOSE GEL     STEP 2: RECHECK blood sugar in 15 MINUTES STEP 3: If your blood sugar is still low at the 15 minute recheck --> then, go back to STEP 1 and treat AGAIN with another 15 grams of carbohydrates.

## 2020-05-24 NOTE — Progress Notes (Signed)
Name: Jeanette Meyer  Age/ Sex: 45 y.o., female   MRN/ DOB: [id number], [birth year]     PCP: Marrian Salvage, [place]   Reason for Endocrinology Evaluation: Type 2 Diabetes Mellitus  Initial Endocrine Consultative Visit: 03/07/18    PATIENT IDENTIFIER: Jeanette Meyer is a 45 y.o. female with a past medical history of HTN and DM. The patient has followed with Endocrinology clinic since 03/07/18 for consultative assistance with management of her diabetes.    DIABETIC HISTORY:  Ms. Pinard was diagnosed with T2DM at age 62. She was initially on MDI insulin regimen ~ months. With lifestyle changes and weight loss she was able to come off insulin. She was switched to oral glycemic agents at the time. Over the years she has tried Trulitcity which caused palpitations, Januvia caused joint pains. She has also tried [place] but due to a single episode of UTI patient states this was discontinued but we restarted it 10/2018. Lantus was added in August, 2019 to her regimen . Her hemoglobin A1c was 12.2% on her presentation to our clinic   Prandial insulin started 02/2018  She is intolerant to BID dosing of metformin   Switched MDI regimen to insulin mix 04/2019  SUBJECTIVE:   During the last visit (01/12/2020 ): A1c 9.5%. Continued metformin, farxiga and insulin mix.     Today (05/24/2020): Ms. Kerman is here for a follow up on her diabetes management.She checks her blood sugars sporadically . The patient has had hypoglycemic episodes since the last clinic visit.    Denies nausea or diarrhea   Has noted tingling and pain of the left index and middle finger   Hours of work 7p - 7 Am     HOME DIABETES REGIMEN:  Humalog Mix 24 units BID  Metformin 750 XR mg daily  Farxiga 10 mg daily      METER DOWNLOAD SUMMARY: Unable to download   100-369 mg/dL   HISTORY:  Past Medical History:  Past Medical History:  Diagnosis Date  . Diabetes mellitus without complication  ([place])   . Fibroid   . GERD (gastroesophageal reflux disease)   . Hypertension    Past Surgical History:  Past Surgical History:  Procedure Laterality Date  . boil removal  02/2019   [facility]  . COLONOSCOPY     2002-2004  . MYOMECTOMY    . RECTAL POLYPECTOMY     2002-2004    Social History:  reports that she has never smoked. She has never used smokeless tobacco. She reports current alcohol use. She reports that she does not use drugs. Family History:  Family History  Problem Relation Age of Onset  . Diabetes Mother   . Hypertension Mother   . Diabetes Maternal Grandmother   . Cancer Maternal Grandmother   . Hypertension Maternal Grandmother   . Liver cancer Maternal Grandmother   . Crohn's disease Maternal Uncle   . Colon cancer Neg Hx   . Esophageal cancer Neg Hx      HOME MEDICATIONS: Allergies as of 05/24/2020      Reactions   Bactrim [sulfamethoxazole-trimethoprim] Diarrhea, Nausea And Vomiting   Clarithromycin Anaphylaxis   Doxycycline Anaphylaxis   Latex       Medication List       Accurate as of May 24, 2020  1:28 PM. If you have any questions, ask your nurse or doctor.        STOP taking these medications   Clenpiq  10-3.5-12 MG-GM -GM/160ML Soln Generic drug: Sod Picosulfate-Mag Ox-Cit Acd Stopped by: Dorita Sciara, MD     TAKE these medications   amLODipine 5 MG tablet Commonly known as: NORVASC Take 1 tablet (5 mg total) by mouth daily.   Blood Glucose Monitor System w/Device Kit Dispense Freestyle meter or per insurance coverage. Check glucose once daily - ICD 10- E11.65 What changed: additional instructions   cetirizine 10 MG tablet Commonly known as: ZYRTEC Take 10 mg by mouth daily.   ELDERBERRY PO Take by mouth daily. Uses pill and syrup   Farxiga 10 MG Tabs tablet Generic drug: dapagliflozin propanediol TAKE 1 TABLET BY MOUTH ONCE DAILY BEFORE BREAKFAST. What changed: See the new instructions. Changed  by: Dorita Sciara, MD   fluconazole 150 MG tablet Commonly known as: DIFLUCAN SMARTSIG:1 Tablet(s) By Mouth   glucose blood test strip Check fasting blood sugar once daily.   Insulin Lispro Prot & Lispro (75-25) 100 UNIT/ML Kwikpen Commonly known as: HumaLOG Mix 75/25 KwikPen Inject 24 Units into the skin 2 (two) times daily with a meal.   lisinopril 20 MG tablet Commonly known as: ZESTRIL TAKE 1 TABLET BY MOUTH 2 TIMES DAILY.   metFORMIN 750 MG 24 hr tablet Commonly known as: GLUCOPHAGE-XR Take 1 tablet (750 mg total) by mouth daily with breakfast.   metoprolol tartrate 25 MG tablet Commonly known as: LOPRESSOR TAKE 1 TABLET BY MOUTH 2 TIMES DAILY.   multivitamin tablet Take 1 tablet by mouth daily.   Ozempic (0.25 or 0.5 MG/DOSE) 2 MG/1.5ML Sopn Generic drug: Semaglutide(0.25 or 0.5MG /DOS) Inject 0.5 mg into the skin once a week. Started by: Dorita Sciara, MD   Pen Needles 32G X 4 MM Misc 1 Device by Does not apply route 2 (two) times daily with a meal.   PROBIOTIC PO Take by mouth daily.   rizatriptan 10 MG tablet Commonly known as: Maxalt Take 1 tablet (10 mg total) by mouth as needed for migraine. May repeat in 2 hours if needed        OBJECTIVE:   Vital Signs: BP 140/86   Pulse 88   Ht 5\' 4"  (1.626 m)   Wt 216 lb 6 oz (98.1 kg)   LMP 05/12/2020   SpO2 98%   BMI 37.14 kg/m   Wt Readings from Last 3 Encounters:  05/24/20 216 lb 6 oz (98.1 kg)  03/16/20 219 lb (99.3 kg)  02/24/20 219 lb 6 oz (99.5 kg)     Exam: General: Pt appears well and is in NAD  Neck: General: Supple without adenopathy. Thyroid: Thyroid size normal.  No goiter or nodules appreciated.  Lungs: Clear with good BS bilat with no rales, rhonchi, or wheezes  Heart: RRR with normal S1 and S2 and no gallops; no murmurs; no rub  Abdomen: Normoactive bowel sounds, soft, nontender, without masses or organomegaly palpable  Extremities: Trace pretibial edema.   Neuro: MS  is good with appropriate affect, pt is alert and Ox3    DM Foot Exam: 05/24/2020 The skin of the feet is intact without sores or ulcerations. The pedal pulses are 2+ on right and 2+ on left. The sensation is intact to a screening 5.07, 10 gram monofilament bilaterally    DATA REVIEWED:  Lab Results  Component Value Date   HGBA1C 9.1 (A) 05/24/2020   HGBA1C 9.5 (A) 01/12/2020   HGBA1C 9.2 (A) 04/28/2019   Lab Results  Component Value Date   MICROALBUR <0.7 04/28/2019   [place]  92 10/27/2018   CREATININE 1.13 04/28/2019      ASSESSMENT / PLAN / RECOMMENDATIONS:   1) Type 2 Diabetes Mellitus, Poorly controlled, without complications - Most recent A1c of 9.1 %. Goal A1c < 7.0%.      - A1c slightly improving  - She is not interested in insulin pumps  - We discussed add-on therapy with GLP-1 agonists, she has tried trulicity in the past and developed palpitations , cautioned against GI side effects  - Intolerant to BID dosing of metformin   MEDICATIONS:   Continue Humalog Mix 24 units With Breakfast and Supper   Continue Metformin 750mg  XR Once daily   Continue Farxiga 10 mg daily   Start Ozempic 0.25 mg weekly for 6 weeks, then increase to 0.5 mg weekly   EDUCATION / INSTRUCTIONS:  BG monitoring instructions: Patient is instructed to check her blood sugars 2 times a day, before breakfast and supper .  Call [place] [facility] if: BG persistently < 70  . I reviewed the Rule of 15 for the treatment of hypoglycemia in detail with the patient. Literature supplied.     2) Diabetic complications:   Eye: Does not have known diabetic retinopathy.   Neuro/ Feet: does not  have known diabetic peripheral neuropathy .  Renal: Patient does not have known baseline CKD.  She is on an ACEI/ARB at present.      F/U in 4 months    Signed electronically by: Mack Guise, MD  [facility] End[facility]  [facility] [address] [place], [place] [address] Phone: [phone] FAX: [phone]   CC: Marrian Salvage, [place] [address] Phone: [phone]  Fax: [phone]  Return to Endocrinology clinic as below: Future Appointments  Date Time Provider [place]  07/18/2020  8:00 AM GI[facility] DIAG TOMO 1 GI-BCGMM GI-BREAST CE  07/18/2020  8:10 AM [facility] [place] 1 GI-[facility] GI-BREAST CE  09/27/2020  8:50 AM Deysy Schabel, Melanie Crazier, MD [facility]

## 2020-05-25 MED FILL — PFIZER-BIONTECH COVID-19 VA: 30 | 21 days supply | Qty: 0 | Fill #0

## 2020-06-14 ENCOUNTER — Other Ambulatory Visit: Payer: Self-pay | Admitting: Internal Medicine

## 2020-06-14 ENCOUNTER — Telehealth: Payer: Self-pay | Admitting: Internal Medicine

## 2020-06-14 MED ORDER — INSULIN LISPRO PROT & LISPRO (75-25 MIX) 100 UNIT/ML KWIKPEN: 24.0000 [IU] | PEN_INJECTOR | Freq: Two times a day (BID) | SUBCUTANEOUS | 2 refills | Status: DC

## 2020-06-14 NOTE — Telephone Encounter (Signed)
Refill sent. Not sure why it was refused. Patient was seen on 05/24/2020.

## 2020-06-14 NOTE — Telephone Encounter (Signed)
Patient scheduled already for may - called asking about her refill.

## 2020-06-14 NOTE — Addendum Note (Signed)
Addended by: Jacqualin Combes on: 06/14/2020 10:29 AM   Modules accepted: Orders

## 2020-06-15 MED FILL — HUMALOG MIX 75-25 KWIKPEN: (75-25) 100 | 25 days supply | Qty: 12 | Fill #0

## 2020-07-01 ENCOUNTER — Encounter (INDEPENDENT_AMBULATORY_CARE_PROVIDER_SITE_OTHER): Payer: Self-pay

## 2020-07-12 ENCOUNTER — Other Ambulatory Visit: Payer: Self-pay | Admitting: Family

## 2020-07-12 MED FILL — FLUCONAZOLE 150 MG TABS: 150 | 28 days supply | Qty: 4 | Fill #0

## 2020-07-12 MED FILL — AMLODIPINE BESYLATE 5 MG TA: 5 | 30 days supply | Qty: 30 | Fill #0

## 2020-07-15 ENCOUNTER — Other Ambulatory Visit: Payer: Self-pay | Admitting: Family

## 2020-07-15 DIAGNOSIS — N6489 Other specified disorders of breast: Secondary | ICD-10-CM

## 2020-07-17 ENCOUNTER — Emergency Department (HOSPITAL_COMMUNITY): Payer: PRIVATE HEALTH INSURANCE

## 2020-07-17 ENCOUNTER — Emergency Department (HOSPITAL_COMMUNITY)
Admission: EM | Admit: 2020-07-17 | Discharge: 2020-07-17 | Disposition: A | Discharge status: Home / Self Care | Payer: PRIVATE HEALTH INSURANCE | Attending: Emergency Medicine | Admitting: Emergency Medicine

## 2020-07-17 DIAGNOSIS — M25561 Pain in right knee: Secondary | ICD-10-CM | POA: Insufficient documentation

## 2020-07-17 DIAGNOSIS — Z91040 Latex allergy status: Secondary | ICD-10-CM | POA: Diagnosis not present

## 2020-07-17 DIAGNOSIS — Z794 Long term (current) use of insulin: Secondary | ICD-10-CM | POA: Diagnosis not present

## 2020-07-17 DIAGNOSIS — W133XXA Fall through floor, initial encounter: Secondary | ICD-10-CM | POA: Diagnosis not present

## 2020-07-17 DIAGNOSIS — E119 Type 2 diabetes mellitus without complications: Secondary | ICD-10-CM | POA: Diagnosis not present

## 2020-07-17 DIAGNOSIS — M1711 Unilateral primary osteoarthritis, right knee: Secondary | ICD-10-CM | POA: Diagnosis not present

## 2020-07-17 DIAGNOSIS — M25461 Effusion, right knee: Secondary | ICD-10-CM

## 2020-07-17 DIAGNOSIS — I10 Essential (primary) hypertension: Secondary | ICD-10-CM | POA: Insufficient documentation

## 2020-07-17 DIAGNOSIS — Z79899 Other long term (current) drug therapy: Secondary | ICD-10-CM | POA: Insufficient documentation

## 2020-07-17 DIAGNOSIS — Z7984 Long term (current) use of oral hypoglycemic drugs: Secondary | ICD-10-CM | POA: Insufficient documentation

## 2020-07-17 MED ORDER — KETOROLAC TROMETHAMINE 30 MG/ML IJ SOLN
30.0000 mg | Freq: Once | INTRAMUSCULAR | Status: AC
  Administered 2020-07-17: 30 mg via INTRAMUSCULAR
  Filled 2020-07-17: qty 1

## 2020-07-17 NOTE — Discharge Instructions (Signed)
Your x-ray shows some signs of arthritis but no fracture.  Concerned you may have injured your medial collateral ligament.  Please use knee immobilizer and crutches, stay off of the leg and ice and elevate as much as possible.  Use ibuprofen 600 mg every 6 hours and Tylenol 1000 mg every 6 hours to help with pain.  Please call to schedule follow-up appointment with orthopedics for further evaluation.

## 2020-07-17 NOTE — Progress Notes (Signed)
Orthopedic Tech Progress Note Patient Details:  JAILEE JAQUEZ 02/22/1976 [id number]  Ortho Devices Type of Ortho Device: Knee Immobilizer,Crutches Ortho Device/Splint Location: Right Lower Extremity Ortho Device/Splint Interventions: Ordered,Application,Adjustment   Post Interventions Patient Tolerated: Well Instructions Provided: Adjustment of device,Care of device,Poper ambulation with device   Khalin Royce P Lorel Monaco 07/17/2020, 10:31 AM

## 2020-07-17 NOTE — ED Provider Notes (Signed)
[facility] EMERGENCY DEPARTMENT Provider Note   CSN: [id number] Arrival date & time: 07/17/20  9485     History Chief Complaint  Patient presents with  . Knee Pain    Jeanette Meyer is a 45 y.o. female.  Jeanette Meyer is a 45 y.o. female with a history of hypertension, diabetes, GERD, uterine fibroids, who presents to the ED for evaluation of right knee pain.  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  She is noticed swelling over the medial aspect of the right knee where pain is worse.  No pain in the calf, ankle or foot.  She does report that because she has been walking funny due to pain she has a little bit of stiffness with movement in the hip, but no tenderness over the hip.  No history of prior knee injuries or surgeries.  Reports that her lower leg feels a bit funny but she has sensation intact.  Reports the knee feels "crunchy".  She took a Bayer aspirin overnight due to pain without much improvement, has not tried anything else to treat pain.  No other aggravating or alleviating factors.        Past Medical History:  Diagnosis Date  . Diabetes mellitus without complication ([place])   . Fibroid   . GERD (gastroesophageal reflux disease)   . Hypertension     Patient Active Problem List   Diagnosis Date Noted  . Dyslipidemia 01/27/2019  . At risk for cardiovascular event 10/28/2018  . Abscess of pubic region 04/29/2018  . Essential hypertension 01/17/2018  . Uncontrolled type 2 diabetes mellitus ([place]) 01/17/2018  . H/O rectal polypectomy 01/17/2018  . Fibroid uterus 11/06/2017  . Abnormal uterine bleeding (AUB)  11/06/2017  . Abscess of groin, left 11/06/2017    Past Surgical History:  Procedure Laterality Date  . boil removal  02/2019   [facility]  . COLONOSCOPY     2002-2004  . MYOMECTOMY    . RECTAL POLYPECTOMY     2002-2004     OB History    Gravida  0   Para  0   Term  0   Preterm  0   AB  0   Living  0     SAB  0   IAB  0   Ectopic  0   Multiple  0   Live Births  0           Family History  Problem Relation Age of Onset  . Diabetes Mother   . Hypertension Mother   . Diabetes Maternal Grandmother   . Cancer Maternal Grandmother   . Hypertension Maternal Grandmother   . Liver cancer Maternal Grandmother   . Crohn's disease Maternal Uncle   . Colon cancer Neg Hx   . Esophageal cancer Neg Hx     Social History   Tobacco Use  . Smoking status: Never Smoker  . Smokeless tobacco: Never Used  Vaping Use  . Vaping Use: Never used  Substance Use Topics  . Alcohol use: Yes  . Drug use: Never    Home  Medications Prior to Admission medications   Medication Sig Start Date End Date Taking? Authorizing Provider  amLODipine (NORVASC) 5 MG tablet TAKE 1 TABLET (5 MG TOTAL) BY MOUTH DAILY. 07/12/20   Marrian Salvage, FNP  Blood Glucose Monitoring Suppl (BLOOD GLUCOSE MONITOR SYSTEM) w/Device KIT Dispense Freestyle meter or per insurance coverage. Check glucose once daily - ICD 10- E11.65 Patient taking differently: Dispense Freestyle meter or per insurance coverage. Check glucose once daily - ICD 10- E11.65  Dario 11/17/17   Jaynee Eagles, PA-C  cetirizine (ZYRTEC) 10 MG tablet Take 10 mg by mouth daily.    [provider]  ELDERBERRY PO Take by mouth daily. Uses pill and syrup    [provider]  FARXIGA 10 MG TABS tablet TAKE 1 TABLET BY MOUTH ONCE DAILY BEFORE BREAKFAST. 05/24/20   Elayne Snare, MD  fluconazole (DIFLUCAN) 150 MG tablet SMARTSIG:1 Tablet(s) By Mouth 03/08/20   [provider]  glucose blood test  strip Check fasting blood sugar once daily. 03/09/18   Shamleffer, Melanie Crazier, MD  Insulin Lispro Prot & Lispro (HUMALOG MIX 75/25 KWIKPEN) (75-25) 100 UNIT/ML Kwikpen Inject 24 Units into the skin 2 (two) times daily with a meal. 06/14/20   Shamleffer, Melanie Crazier, MD  Insulin Pen Needle (PEN NEEDLES) 32G X 4 MM MISC 1 Device by Does not apply route 2 (two) times daily with a meal. 04/28/19   Shamleffer, Melanie Crazier, MD  lisinopril (ZESTRIL) 20 MG tablet TAKE 1 TABLET BY MOUTH 2 TIMES DAILY. 12/11/19   Marrian Salvage, FNP  metFORMIN (GLUCOPHAGE-XR) 750 MG 24 hr tablet Take 1 tablet (750 mg total) by mouth daily with breakfast. 04/28/19   Shamleffer, Melanie Crazier, MD  metoprolol tartrate (LOPRESSOR) 25 MG tablet TAKE 1 TABLET BY MOUTH 2 TIMES DAILY. 12/11/19   Marrian Salvage, FNP  Multiple Vitamin (MULTIVITAMIN) tablet Take 1 tablet by mouth daily.    [provider]  Probiotic Product (PROBIOTIC PO) Take by mouth daily.    [provider]  rizatriptan (MAXALT) 10 MG tablet Take 1 tablet (10 mg total) by mouth as needed for migraine. May repeat in 2 hours if needed 08/26/19   Marrian Salvage, FNP  Semaglutide,0.25 or 0.5MG/DOS, (OZEMPIC, 0.25 OR 0.5 MG/DOSE,) 2 MG/1.5ML SOPN Inject 0.5 mg into the skin once a week. 05/24/20   Shamleffer, Melanie Crazier, MD    Allergies    Bactrim [sulfamethoxazole-trimethoprim], Clarithromycin, Doxycycline, and Latex  Review of Systems   Review of Systems  Constitutional: Negative for chills and fever.  Cardiovascular: Positive for leg swelling.  Musculoskeletal: Positive for arthralgias and joint swelling.  Skin: Negative for color change and rash.  Neurological: Negative for weakness and numbness.  All other systems reviewed and are negative.   Physical Exam Updated Vital Signs BP (!) 156/94   Pulse 98   Temp 98.5 F (36.9 C)   Resp 18   SpO2 100%   Physical Exam Vitals and nursing note reviewed.   Constitutional:      General: She is not in acute distress.    Appearance: Normal appearance. She is well-developed, normal weight and well-nourished. She is not ill-appearing or diaphoretic.  HENT:     Head: Normocephalic and atraumatic.  Eyes:     General:        Right eye: No discharge.        Left eye: No discharge.  Pulmonary:     Effort: Pulmonary effort is normal. No respiratory distress.  Musculoskeletal:        General: Swelling and tenderness present.     Comments: Swelling medial portion of the right knee with palpable effusion, no tenderness over the lateral knee, pain worsens with valgus stress, no pain with varus stress.  No tenderness over the foot or ankle.  Patient reports some soreness with walking and hip but she has no tenderness over the hip with normal range of motion.  Skin:    General: Skin is warm and dry.  Neurological:     Mental Status: She is alert and oriented to person, place, and time.     Coordination: Coordination normal.  Psychiatric:        Mood and Affect: Mood and affect and mood normal.        Behavior: Behavior normal.     ED Results / Procedures / Treatments   Labs (all labs ordered are listed, but only abnormal results are displayed) Labs Reviewed - No data to display  EKG None  Radiology DG Knee Complete 4 Views Right  Result Date: 07/17/2020 CLINICAL DATA:  Medial knee pain since Friday, history of prior knee injury in 2017 with meniscal tear EXAM: RIGHT KNEE - COMPLETE 4+ VIEW COMPARISON:  None. FINDINGS: No evidence of fracture, dislocation, or joint effusion. Tricompartment degenerative joint disease most notably in the medial compartment where it is moderate. Soft tissues are unremarkable. IMPRESSION: No acute osseous abnormality. Tricompartment degenerative joint disease. Electronically Signed   By: Dahlia Bailiff MD   On: 07/17/2020 09:23    Procedures Procedures   Medications Ordered in ED Medications  ketorolac  (TORADOL) 30 MG/ML injection 30 mg (30 mg Intramuscular Given 07/17/20 9937)    ED Course  I have reviewed the triage vital signs and the nursing notes.  Pertinent labs & imaging results that were available during my care of the patient were reviewed by me and considered in my medical decision making (see chart for details).    MDM Rules/Calculators/A&P                         46 year old female presents with right-sided knee pain after she stepped wrong twisting the knee at work Friday night.  She reports initially pain was minimal and she did not think much of it but at work overnight last night she started to notice worsening pain with swelling along the medial aspect of the knee.  Pain is present at rest but worse with movement or range of motion.  She feels like the knee feels crunchy.  Sensation and strength intact in the lower leg.  Decreased range of motion due to pain.  No redness, warmth, fevers, given associated injury I have low suspicion for septic arthritis.  X-rays of the knee show no acute osseous abnormality, tricompartmental degenerative joint changes noted.  Suspect patient may have injured her MCL or meniscus.  Will place in knee immobilizer and provide crutches, recommend NSAIDs, Tylenol, ice and elevation for pain management and orthopedic follow-up.  Patient provided work note.  She expresses understanding and agreement with this plan.  Discharged home in good condition.  Final Clinical Impression(s) / ED Diagnoses Final diagnoses:  Pain and swelling of right knee    Rx / DC Orders ED Discharge Orders    None       Jacqlyn Larsen, [place] 07/17/20 [place], MD 07/17/20 1504

## 2020-07-17 NOTE — ED Triage Notes (Signed)
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 8/10 not moving but 10/10 moving.  Pt states that foot feels numb and knee is "crutchy".

## 2020-07-18 ENCOUNTER — Other Ambulatory Visit (HOSPITAL_COMMUNITY): Payer: Self-pay | Admitting: Surgical

## 2020-07-18 ENCOUNTER — Other Ambulatory Visit: Payer: 59

## 2020-07-18 DIAGNOSIS — M1711 Unilateral primary osteoarthritis, right knee: Secondary | ICD-10-CM | POA: Diagnosis not present

## 2020-07-18 MED FILL — MELOXICAM 15 MG TABLET: 15 | 30 days supply | Qty: 30 | Fill #0

## 2020-07-25 ENCOUNTER — Other Ambulatory Visit (HOSPITAL_COMMUNITY): Payer: Self-pay

## 2020-07-25 MED FILL — HUMALOG MIX 75-25 KWIKPEN: (75-25) 100 | 31 days supply | Qty: 15 | Fill #0

## 2020-07-25 MED FILL — PREVIDENT 0.2% RINSE: 0.2 | 15 days supply | Qty: 473 | Fill #0

## 2020-08-11 ENCOUNTER — Other Ambulatory Visit (HOSPITAL_COMMUNITY): Payer: Self-pay | Admitting: Orthopedic Surgery

## 2020-08-11 DIAGNOSIS — M25561 Pain in right knee: Secondary | ICD-10-CM

## 2020-08-18 ENCOUNTER — Other Ambulatory Visit: Payer: Self-pay

## 2020-08-18 ENCOUNTER — Ambulatory Visit (HOSPITAL_COMMUNITY)
Admission: RE | Admit: 2020-08-18 | Discharge: 2020-08-18 | Disposition: A | Discharge status: Home / Self Care | Payer: PRIVATE HEALTH INSURANCE | Source: Ambulatory Visit | Attending: Orthopedic Surgery | Admitting: Orthopedic Surgery

## 2020-08-18 DIAGNOSIS — M25561 Pain in right knee: Secondary | ICD-10-CM | POA: Diagnosis not present

## 2020-08-23 ENCOUNTER — Other Ambulatory Visit (HOSPITAL_BASED_OUTPATIENT_CLINIC_OR_DEPARTMENT_OTHER): Payer: Self-pay

## 2020-08-23 MED FILL — Dapagliflozin Propanediol Tab 10 MG (Base Equivalent): ORAL | 90 days supply | Qty: 90 | Fill #0 | Status: CN

## 2020-08-30 ENCOUNTER — Other Ambulatory Visit (HOSPITAL_BASED_OUTPATIENT_CLINIC_OR_DEPARTMENT_OTHER): Payer: Self-pay

## 2020-08-30 IMAGING — MG MM DIGITAL DIAGNOSTIC UNILAT*R* W/ TOMO W/ CAD
4 series · 4 of 12 positions shown · non-contrast
Comparison: Previous exam(s).

CLINICAL DATA: 44-year-old female for follow-up of RIGHT breast
asymmetry with sonographic correlate of normal fibroglandular tissue

EXAM:
DIGITAL DIAGNOSTIC UNILATERAL RIGHT MAMMOGRAM WITH CAD AND TOMO

[R MLO synth-2D]
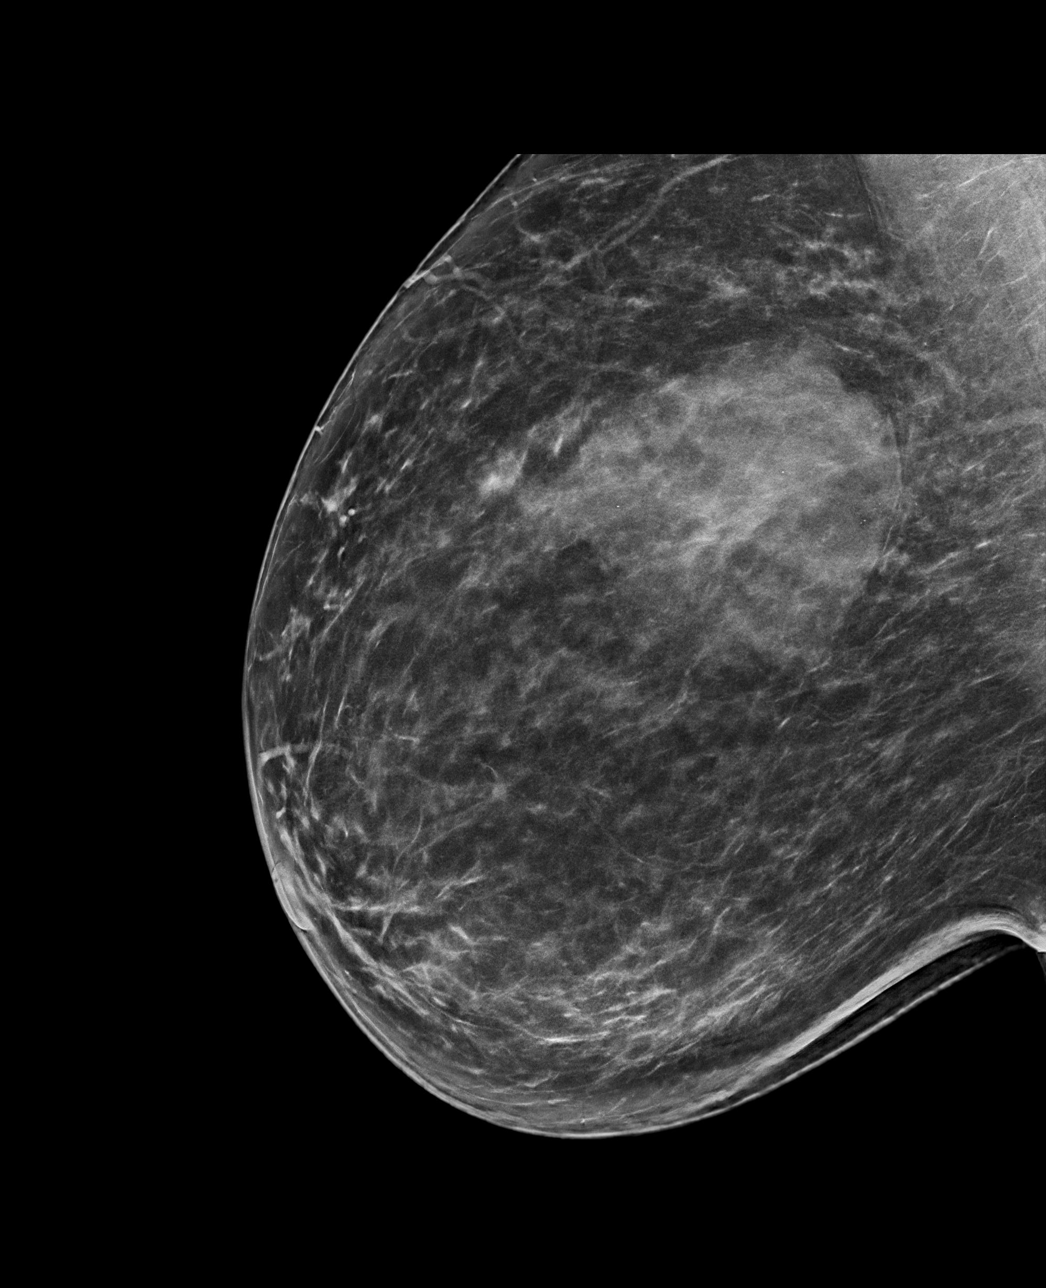

[R CC synth-2D]
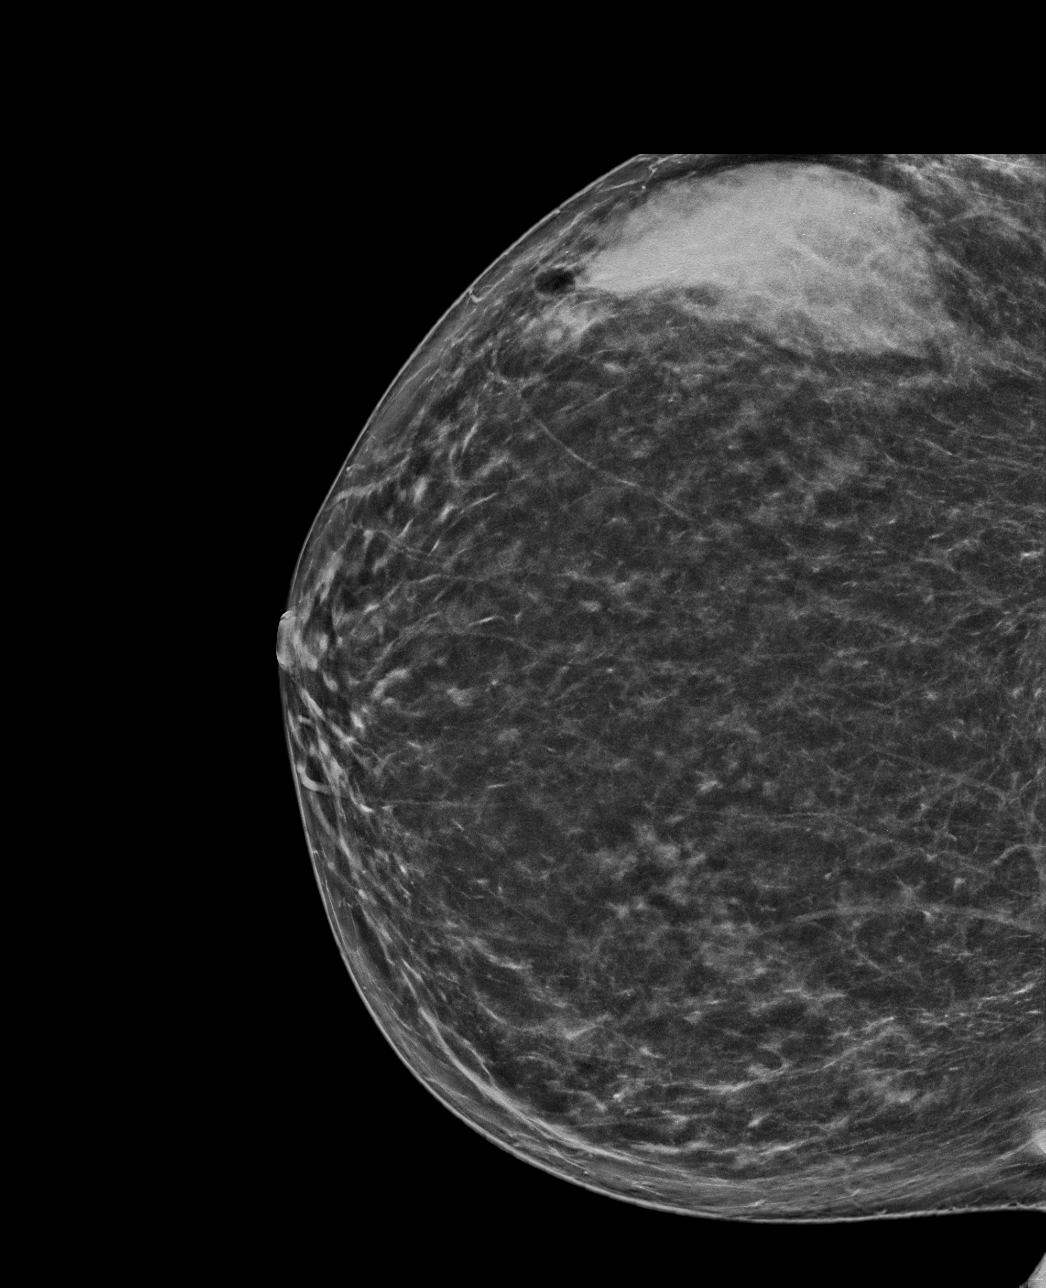

[R CC tomo · tomo slice 39/78.0]
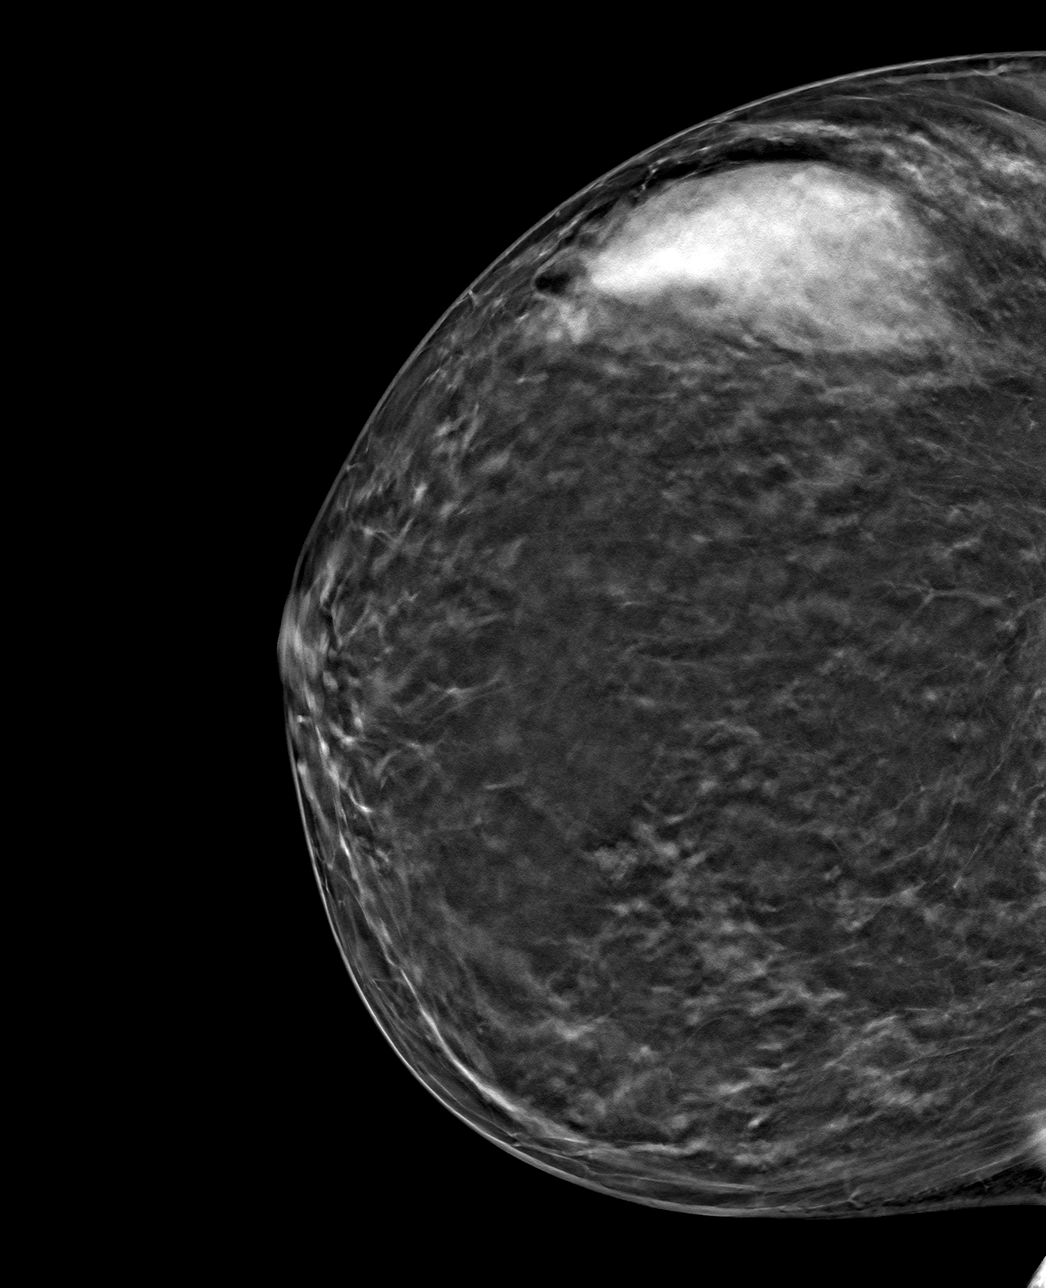

[R MLO tomo · tomo slice 47/94.0]
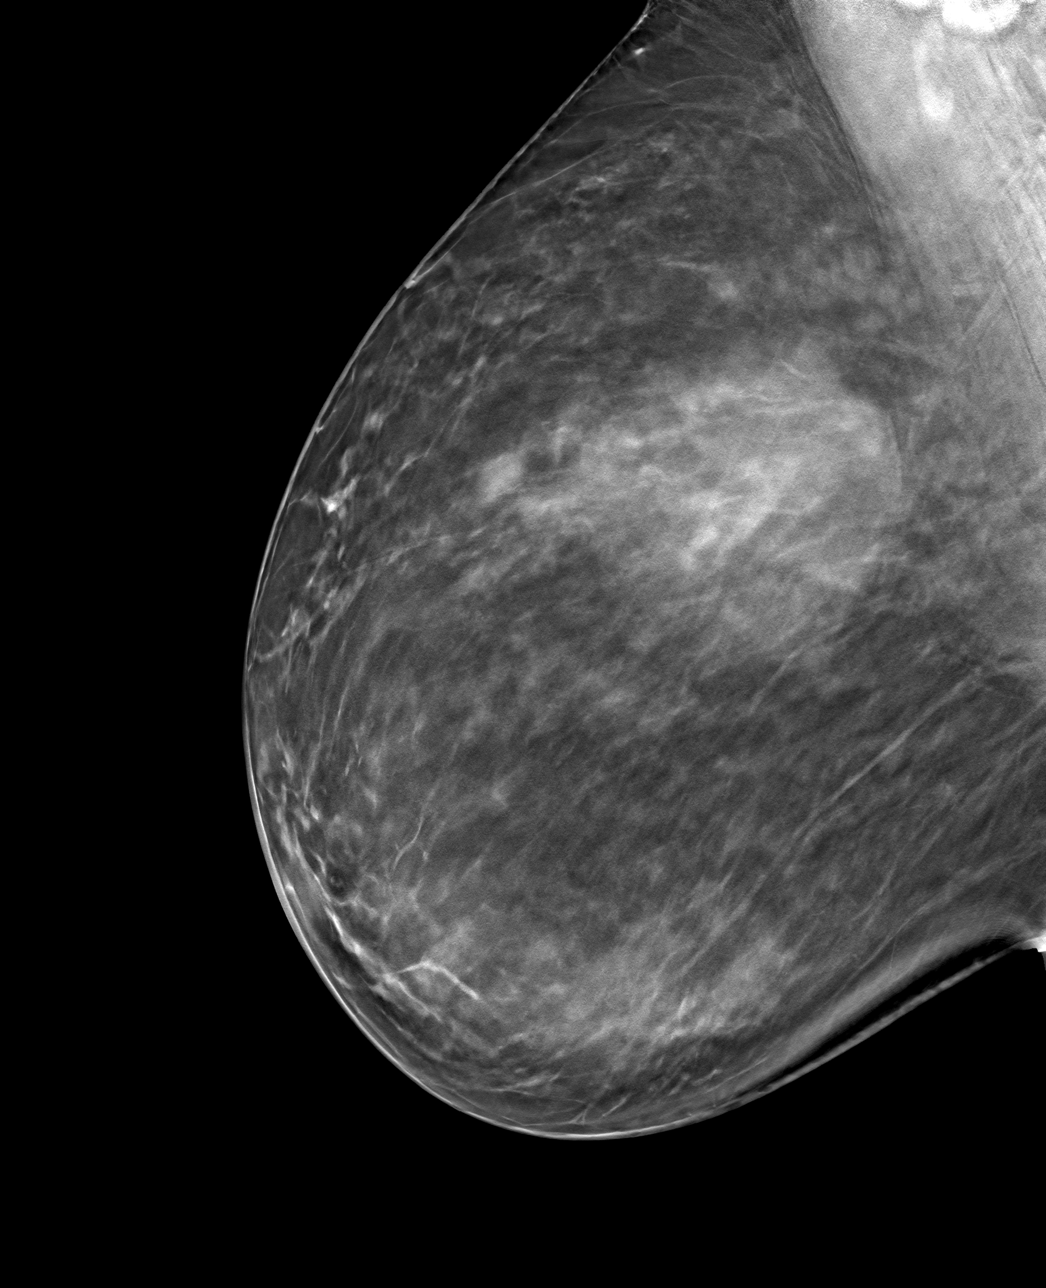

[4 of 12 positions shown; findings below may reference images not displayed]

ACR Breast Density Category b: There are scattered areas of
fibroglandular density.
FINDINGS: 2D/3D full field views of the RIGHT breast demonstrate unchanged
focal asymmetry within the UPPER-OUTER RIGHT breast. Scattered
punctate calcifications within this density again noted.

No suspicious mass, distortion, worrisome calcification or new
mammographic abnormality noted.

Mammographic images were processed with CAD.
IMPRESSION: Stable focal asymmetry within the UPPER-OUTER RIGHT breast, likely
representing normal fibroglandular tissue as identified on prior
study. One additional follow-up in 6 months is recommended to ensure
1 year stability.

RECOMMENDATION:
Bilateral diagnostic mammogram with possible RIGHT breast ultrasound
in 6 months.

I have discussed the findings and recommendations with the patient.
If applicable, a reminder letter will be sent to the patient
regarding the next appointment.

BI-RADS CATEGORY  3: Probably benign.

## 2020-09-07 MED FILL — Insulin Lispro Prot & Lispro Sus Pen-inj 100 Unit/ML (75-25): SUBCUTANEOUS | 31 days supply | Qty: 15 | Fill #0 | Status: AC

## 2020-09-08 ENCOUNTER — Other Ambulatory Visit (HOSPITAL_COMMUNITY): Payer: Self-pay

## 2020-09-08 MED FILL — Semaglutide Soln Pen-inj 0.25 or 0.5 MG/DOSE (2 MG/1.5ML): SUBCUTANEOUS | 84 days supply | Qty: 4.5 | Fill #0 | Status: AC

## 2020-09-09 ENCOUNTER — Other Ambulatory Visit (HOSPITAL_COMMUNITY): Payer: Self-pay

## 2020-09-09 MED FILL — Dapagliflozin Propanediol Tab 10 MG (Base Equivalent): ORAL | 30 days supply | Qty: 30 | Fill #0 | Status: CN

## 2020-09-13 ENCOUNTER — Telehealth (INDEPENDENT_AMBULATORY_CARE_PROVIDER_SITE_OTHER): Payer: 59 | Admitting: Family

## 2020-09-13 ENCOUNTER — Encounter: Payer: Self-pay | Admitting: Family

## 2020-09-13 ENCOUNTER — Other Ambulatory Visit: Payer: Self-pay

## 2020-09-13 ENCOUNTER — Other Ambulatory Visit: Payer: Self-pay | Admitting: Family

## 2020-09-13 ENCOUNTER — Other Ambulatory Visit (HOSPITAL_BASED_OUTPATIENT_CLINIC_OR_DEPARTMENT_OTHER): Payer: Self-pay

## 2020-09-13 VITALS — Ht 64.0 in | Wt 217.0 lb

## 2020-09-13 DIAGNOSIS — J069 Acute upper respiratory infection, unspecified: Secondary | ICD-10-CM | POA: Diagnosis not present

## 2020-09-13 DIAGNOSIS — I10 Essential (primary) hypertension: Secondary | ICD-10-CM | POA: Diagnosis not present

## 2020-09-13 DIAGNOSIS — U071 COVID-19: Secondary | ICD-10-CM

## 2020-09-13 DIAGNOSIS — G43809 Other migraine, not intractable, without status migrainosus: Secondary | ICD-10-CM

## 2020-09-13 MED ORDER — METOPROLOL TARTRATE 25 MG PO TABS
ORAL_TABLET | Freq: Two times a day (BID) | ORAL | 1 refills | Status: DC
  Filled 2020-09-13: qty 180, 90d supply, fill #0
  Filled 2020-12-30: qty 180, 90d supply, fill #1

## 2020-09-13 MED ORDER — AMLODIPINE BESYLATE 5 MG PO TABS
ORAL_TABLET | Freq: Every day | ORAL | 0 refills | Status: DC
  Filled 2020-09-13: qty 90, 90d supply, fill #0

## 2020-09-13 MED ORDER — HYDROCODONE-HOMATROPINE 5-1.5 MG/5ML PO SYRP
5.0000 mL | ORAL_SOLUTION | Freq: Three times a day (TID) | ORAL | 0 refills | Status: DC | PRN
  Filled 2020-09-13: qty 120, 8d supply, fill #0

## 2020-09-13 MED ORDER — LISINOPRIL 20 MG PO TABS: 20.0000 mg | ORAL_TABLET | Freq: Every day | ORAL | 0 refills | Status: DC

## 2020-09-13 MED ORDER — RIZATRIPTAN BENZOATE 10 MG PO TABS
10.0000 mg | ORAL_TABLET | ORAL | 1 refills | Status: AC | PRN
  Filled 2020-09-13: qty 18, 30d supply, fill #0

## 2020-09-13 NOTE — Progress Notes (Signed)
KALAYNA NOY is a 45 y.o. female with the following history as recorded in [employer]:  Patient Active Problem List   Diagnosis Date Noted  . Dyslipidemia 01/27/2019  . At risk for cardiovascular event 10/28/2018  . Abscess of pubic region 04/29/2018  . Essential hypertension 01/17/2018  . Uncontrolled type 2 diabetes mellitus ([place]) 01/17/2018  . H/O rectal polypectomy 01/17/2018  . Fibroid uterus 11/06/2017  . Abnormal uterine bleeding (AUB) 11/06/2017  . Abscess of groin, left 11/06/2017    Current Outpatient Medications  Medication Sig Dispense Refill  . Blood Glucose Monitoring Suppl (BLOOD GLUCOSE MONITOR SYSTEM) w/Device KIT Dispense Freestyle meter or per insurance coverage. Check glucose once daily - ICD 10- E11.65 (Patient taking differently: Dispense Freestyle meter or per insurance coverage. Check glucose once daily - ICD 10- E11.65  Dario) 1 each 0  . cetirizine (ZYRTEC) 10 MG tablet Take 10 mg by mouth daily.    . dapagliflozin propanediol (FARXIGA) 10 MG TABS tablet TAKE 1 TABLET BY MOUTH ONCE DAILY BEFORE BREAKFAST 90 tablet 1  . ELDERBERRY PO Take by mouth daily. Uses pill and syrup    . fluconazole (DIFLUCAN) 150 MG tablet TAKE 1 TABLET BY MOUTH EVERY 72 HOURS FOR 3 DOSES. THEN CONTINUE 1 TABLET ONCE A WEEK FOR 6 MONTHS. 27 tablet 0  . glucose blood test strip Check fasting blood sugar once daily. 150 each 12  . HYDROcodone-homatropine (HYCODAN) 5-1.5 MG/5ML syrup Take 5 mLs by mouth every 8 (eight) hours as needed for cough. 120 mL 0  . Insulin Lispro Prot & Lispro (HUMALOG 75/25 MIX) (75-25) 100 UNIT/ML Kwikpen INJECT 24 UNITS INTO THE SKIN 2 (TWO) TIMES DAILY WITH A MEAL. 15 mL 2  . Insulin Pen Needle (PEN NEEDLES) 32G X 4 MM MISC 1 Device by Does not apply route 2 (two) times daily with a meal. 200 each 3  . metFORMIN (GLUCOPHAGE-XR) 750 MG 24 hr tablet Take 1 tablet (750 mg total) by mouth daily with breakfast. 90 tablet 3  . Multiple Vitamin (MULTIVITAMIN) tablet  Take 1 tablet by mouth daily.    . Probiotic Product (PROBIOTIC PO) Take by mouth daily.    . Semaglutide,0.25 or 0.5MG /DOS, 2 MG/1.5ML SOPN INJECT 0.5MG  UNDER THE SKIN ONCE A WEEK 1.5 mL 6  . SODIUM FLUORIDE, DENTAL RINSE, 0.2 % SOLN USE AS A ORAL RINSE AS DIRECTED ON BOTTLE. 473 mL 5  . amLODipine (NORVASC) 5 MG tablet TAKE 1 TABLET (5 MG TOTAL) BY MOUTH DAILY. 90 tablet 0  . lisinopril (ZESTRIL) 20 MG tablet Take 1 tablet (20 mg total) by mouth daily. 90 tablet 0  . metoprolol tartrate (LOPRESSOR) 25 MG tablet TAKE 1 TABLET BY MOUTH 2 TIMES DAILY. 180 tablet 1  . rizatriptan (MAXALT) 10 MG tablet Take 1 tablet (10 mg total) by mouth as needed for migraine. May repeat in 2 hours if needed 10 tablet 1   No current facility-administered medications for this visit.    Allergies: Bactrim [sulfamethoxazole-trimethoprim], Clarithromycin, Doxycycline, and Latex  Past Medical History:  Diagnosis Date  . Diabetes mellitus without complication ([place])   . Fibroid   . GERD (gastroesophageal reflux disease)   . Hypertension     Past Surgical History:  Procedure Laterality Date  . boil removal  02/2019   [facility]  . COLONOSCOPY     2002-2004  . MYOMECTOMY    . RECTAL POLYPECTOMY     2002-2004    Family History  Problem Relation Age  of Onset  . Diabetes Mother   . Hypertension Mother   . Diabetes Maternal Grandmother   . Cancer Maternal Grandmother   . Hypertension Maternal Grandmother   . Liver cancer Maternal Grandmother   . Crohn's disease Maternal Uncle   . Colon cancer Neg Hx   . Esophageal cancer Neg Hx     Social History   Tobacco Use  . Smoking status: Never Smoker  . Smokeless tobacco: Never Used  Substance Use Topics  . Alcohol use: Yes    Subjective:   I connected with Selina Cooley on 09/13/20 at  9:40 AM EDT by a video enabled telemedicine application and verified that I am speaking with the correct person using two identifiers.   I discussed the  limitations of evaluation and management by telemedicine and the availability of in person appointments. The patient expressed understanding and agreed to proceed. Provider in office/ patient is at home; provider and patient are only 2 people on video call.   Started on Saturday with cough/ congestion/ sore throat; known history of seasonal allergies; using OTC Sinex and Robitussin DM with some relief; does feel that she is getting better but cough is continuing to be problematic at night; has taken home COVID test- negative;   Also overdue for follow-up on blood pressure- scheduled to see her endocrinologist in 2 weeks; per patient, blood pressure today is 124/78;       Objective:  Vitals:   09/13/20 0927  Weight: 217 lb (98.4 kg)  Height: $Remove'5\' 4"'[id number]$  (1.626 m)    General: Well developed, well nourished, in no acute distress  Skin : Warm and dry.  Head: Normocephalic and atraumatic  Lungs: Respirations unlabored;  Neurologic: Alert and oriented; speech intact; face symmetrical; moves all extremities well; CNII-XII intact without focal deficit   Assessment:  1. Acute URI   2. Essential hypertension   3. Other migraine without status migrainosus, not intractable     Plan:  1. Suspect allergy component; negative COVID; Rx for Hycodan to use as needed; increase fluids, rest and follow-up worse, no better. 2. Refills updated; will need in office visit for follow-up in the next 6 months; will review BP reading at upcoming endo appointment; 3. Refill updated;   No follow-ups on file.  No orders of the defined types were placed in this encounter.   Requested Prescriptions   Signed Prescriptions Disp Refills  . lisinopril (ZESTRIL) 20 MG tablet 90 tablet 0    Sig: Take 1 tablet (20 mg total) by mouth daily.  . metoprolol tartrate (LOPRESSOR) 25 MG tablet 180 tablet 1    Sig: TAKE 1 TABLET BY MOUTH 2 TIMES DAILY.  Marland Kitchen amLODipine (NORVASC) 5 MG tablet 90 tablet 0    Sig: TAKE 1 TABLET (5 MG  TOTAL) BY MOUTH DAILY.  . rizatriptan (MAXALT) 10 MG tablet 10 tablet 1    Sig: Take 1 tablet (10 mg total) by mouth as needed for migraine. May repeat in 2 hours if needed  . HYDROcodone-homatropine (HYCODAN) 5-1.5 MG/5ML syrup 120 mL 0    Sig: Take 5 mLs by mouth every 8 (eight) hours as needed for cough.

## 2020-09-14 ENCOUNTER — Other Ambulatory Visit: Payer: Self-pay | Admitting: Physician Assistant

## 2020-09-14 ENCOUNTER — Telehealth: Payer: Self-pay | Admitting: Physician Assistant

## 2020-09-14 DIAGNOSIS — U071 COVID-19: Secondary | ICD-10-CM

## 2020-09-14 MED ORDER — MOLNUPIRAVIR EUA 200MG CAPSULE: 4.0000 | ORAL_CAPSULE | Freq: Two times a day (BID) | ORAL | 0 refills | Status: AC

## 2020-09-14 NOTE — Telephone Encounter (Signed)
Called to discuss with patient about COVID-19 symptoms and the use of one of the available treatments for those with mild to moderate Covid symptoms and at a high risk of hospitalization.  Pt appears to qualify for outpatient treatment due to co-morbid conditions and/or a member of an at-risk group in accordance with the FDA Emergency Use Authorization.    Symptom onset: 4/23 per referral info Vaccinated: yes Booster? yes Immunocompromised? no Qualifiers: BMI, HTN, DMT2 NIH Criteria: 1  Unable to reach pt - called pt and left a VM and mychart message. She is a [employer] employee  Jeanette Meyer

## 2020-09-14 NOTE — Progress Notes (Signed)
Outpatient Oral COVID Treatment Note  I connected with Jeanette Meyer on 09/14/2020/5:14 PM by telephone and verified that I am speaking with the correct person using two identifiers.  I discussed the limitations, risks, security, and privacy concerns of performing an evaluation and management service by telephone and the availability of in person appointments. I also discussed with the patient that there may be a patient responsible charge related to this service. The patient expressed understanding and agreed to proceed.  Patient location: home Provider location: office  Diagnosis: COVID-19 infection  Purpose of visit: Discussion of potential use of Molnupiravir or Paxlovid, a new treatment for mild to moderate COVID-19 viral infection in non-hospitalized patients.   Subjective: Patient is a 45 y.o. female who has been diagnosed with COVID 19 viral infection.  Their symptoms began on 4/23 with cough and cold sx.    Past Medical History:  Diagnosis Date  . Diabetes mellitus without complication ([place])   . Fibroid   . GERD (gastroesophageal reflux disease)   . Hypertension     Allergies  Allergen Reactions  . Bactrim [Sulfamethoxazole-Trimethoprim] Diarrhea and Nausea And Vomiting  . Clarithromycin Anaphylaxis  . Doxycycline Anaphylaxis  . Latex      Current Outpatient Medications:  .  amLODipine (NORVASC) 5 MG tablet, TAKE 1 TABLET (5 MG TOTAL) BY MOUTH DAILY., Disp: 90 tablet, Rfl: 0 .  Blood Glucose Monitoring Suppl (BLOOD GLUCOSE MONITOR SYSTEM) w/Device KIT, Dispense Freestyle meter or per insurance coverage. Check glucose once daily - ICD 10- E11.65 (Patient taking differently: Dispense Freestyle meter or per insurance coverage. Check glucose once daily - ICD 10- E11.65  [place]), Disp: 1 each, Rfl: 0 .  cetirizine (ZYRTEC) 10 MG tablet, Take 10 mg by mouth daily., Disp: , Rfl:  .  dapagliflozin propanediol (FARXIGA) 10 MG TABS tablet, TAKE 1 TABLET BY MOUTH ONCE DAILY BEFORE  BREAKFAST, Disp: 90 tablet, Rfl: 1 .  ELDERBERRY PO, Take by mouth daily. Uses pill and syrup, Disp: , Rfl:  .  fluconazole (DIFLUCAN) 150 MG tablet, TAKE 1 TABLET BY MOUTH EVERY 72 HOURS FOR 3 DOSES. THEN CONTINUE 1 TABLET ONCE A WEEK FOR 6 MONTHS., Disp: 27 tablet, Rfl: 0 .  glucose blood test strip, Check fasting blood sugar once daily., Disp: 150 each, Rfl: 12 .  HYDROcodone-homatropine (HYCODAN) 5-1.5 MG/5ML syrup, Take 5 mLs by mouth every 8 (eight) hours as needed for cough., Disp: 120 mL, Rfl: 0 .  Insulin Lispro Prot & Lispro (HUMALOG 75/25 MIX) (75-25) 100 UNIT/ML Kwikpen, INJECT 24 UNITS INTO THE SKIN 2 (TWO) TIMES DAILY WITH A MEAL., Disp: 15 mL, Rfl: 2 .  Insulin Pen Needle (PEN NEEDLES) 32G X 4 MM MISC, 1 Device by Does not apply route 2 (two) times daily with a meal., Disp: 200 each, Rfl: 3 .  lisinopril (ZESTRIL) 20 MG tablet, Take 1 tablet (20 mg total) by mouth daily., Disp: 90 tablet, Rfl: 0 .  metFORMIN (GLUCOPHAGE-XR) 750 MG 24 hr tablet, Take 1 tablet (750 mg total) by mouth daily with breakfast., Disp: 90 tablet, Rfl: 3 .  metoprolol tartrate (LOPRESSOR) 25 MG tablet, TAKE 1 TABLET BY MOUTH 2 TIMES DAILY., Disp: 180 tablet, Rfl: 1 .  Multiple Vitamin (MULTIVITAMIN) tablet, Take 1 tablet by mouth daily., Disp: , Rfl:  .  Probiotic Product (PROBIOTIC PO), Take by mouth daily., Disp: , Rfl:  .  rizatriptan (MAXALT) 10 MG tablet, Take 1 tablet (10 mg total) by mouth as needed for migraine. May  repeat in 2 hours if needed, Disp: 10 tablet, Rfl: 1 .  Semaglutide,0.25 or 0.5MG/DOS, 2 MG/1.5ML SOPN, INJECT 0.5MG UNDER THE SKIN ONCE A WEEK, Disp: 1.5 mL, Rfl: 6 .  SODIUM FLUORIDE, DENTAL RINSE, 0.2 % SOLN, USE AS A ORAL RINSE AS DIRECTED ON BOTTLE., Disp: 473 mL, Rfl: 5  Objective: Patient sounds okay on phone.  They are in no apparent distress.  Breathing is non labored.  Mood and behavior are normal.  Laboratory Data:  No results found for this or any previous visit (from the past  2160 hour(s)).   Assessment: 45 y.o. female with mild/moderate COVID 19 viral infection diagnosed on 4/23 at high risk for progression to severe COVID 19.  Plan:  This patient is a 45 y.o. female that meets the following criteria for Emergency Use Authorization of: Molnupiravir  1. Age >18 yr 2. SARS-COV-2 positive test 3. Symptom onset < 5 days 4. Mild-to-moderate COVID disease with high risk for severe progression to hospitalization or death   I have spoken and communicated the following to the patient or parent/caregiver regarding: 1. Molnupiravir is an unapproved drug that is authorized for use under an [employer].  2. There are no adequate, approved, available products for the treatment of COVID-19 in adults who have mild-to-moderate COVID-19 and are at high risk for progressing to severe COVID-19, including hospitalization or death. 3. Other therapeutics are currently authorized. For additional information on all products authorized for treatment or prevention of COVID-19, please see [email or web address].  4. There are benefits and risks of taking this treatment as outlined in the "Fact Sheet for Patients and Caregivers."  5. "Fact Sheet for Patients and Caregivers" was reviewed with patient. A hard copy will be provided to patient from pharmacy prior to the patient receiving treatment. 6. Patients should continue to self-isolate and use infection control measures (e.g., wear mask, isolate, social distance, avoid sharing personal items, clean and disinfect "high touch" surfaces, and frequent handwashing) according to CDC guidelines.  7. The patient or parent/caregiver has the option to accept or refuse treatment. 8. [place] has established a pregnancy surveillance program. 9. Females of childbearing potential should use a reliable method of contraception  correctly and consistently, as applicable, for the duration of treatment and for 4 days after the last dose of Molnupiravir. 15. Males of reproductive potential who are sexually active with females of childbearing potential should use a reliable method of contraception correctly and consistently during treatment and for at least 3 months after the last dose. 11. Pregnancy status and risk was assessed. Patient verbalized understanding of precautions.   After reviewing above information with the patient, the patient agrees to receive molnupiravir.  Follow up instructions:    . Take prescription BID x 5 days as directed . Reach out to pharmacist for counseling on medication if desired . For concerns regarding further COVID symptoms please follow up with your PCP or urgent care . For urgent or life-threatening issues, seek care at your local emergency department  The patient was provided an opportunity to ask questions, and all were answered. The patient agreed with the plan and demonstrated an understanding of the instructions.   Script sent to [facility] and opted to pick up RX.  The patient was advised to call their PCP or seek an in-person evaluation if the symptoms worsen or if the condition fails to improve as anticipated.   I provided 10 minutes of non face-to-face telephone visit time during this  encounter, and > 50% was spent counseling as documented under my assessment & plan.  Angelena Form, PA-C 09/14/2020 /5:14 PM

## 2020-09-27 ENCOUNTER — Encounter: Payer: Self-pay | Admitting: Internal Medicine

## 2020-09-27 ENCOUNTER — Ambulatory Visit: Payer: 59 | Admitting: Internal Medicine

## 2020-09-27 ENCOUNTER — Ambulatory Visit: Payer: 59 | Attending: Internal Medicine

## 2020-09-27 ENCOUNTER — Other Ambulatory Visit (HOSPITAL_BASED_OUTPATIENT_CLINIC_OR_DEPARTMENT_OTHER): Payer: Self-pay

## 2020-09-27 ENCOUNTER — Other Ambulatory Visit: Payer: Self-pay

## 2020-09-27 VITALS — BP 134/84 | HR 94 | Ht 64.0 in | Wt 210.2 lb

## 2020-09-27 DIAGNOSIS — Z23 Encounter for immunization: Secondary | ICD-10-CM

## 2020-09-27 DIAGNOSIS — E1165 Type 2 diabetes mellitus with hyperglycemia: Secondary | ICD-10-CM | POA: Diagnosis not present

## 2020-09-27 LAB — POCT GLYCOSYLATED HEMOGLOBIN (HGB A1C): Hemoglobin A1C: 8.6 % — AB (ref 4.0–5.6)

## 2020-09-27 LAB — POCT GLUCOSE (DEVICE FOR HOME USE): POC Glucose: 168 mg/dl — AB (ref 70–99)

## 2020-09-27 MED FILL — Dapagliflozin Propanediol Tab 10 MG (Base Equivalent): ORAL | 30 days supply | Qty: 30 | Fill #0 | Status: AC

## 2020-09-27 NOTE — Progress Notes (Signed)
   Covid-19 Vaccination Clinic  Name:  Jeanette Meyer    MRN: [id number] DOB: 09/14/1975  09/27/2020  [name] was observed post Covid-19 immunization for 15 minutes without incident. She was provided with Vaccine Information Sheet and instruction to access the V-Safe system.   [name] was instructed to call 911 with any severe reactions post vaccine: Marland Kitchen Difficulty breathing  . Swelling of face and throat  . A fast heartbeat  . A bad rash all over body  . Dizziness and weakness   Immunizations Administered    Name Date Dose VIS Date Route   PFIZER Comrnaty(Gray TOP) Covid-19 Vaccine 09/27/2020  9:39 AM 0.3 mL 04/28/2020 Intramuscular   Manufacturer: [employer]   Lot: [id number]   NDC: [phone]

## 2020-09-27 NOTE — Patient Instructions (Addendum)
-   keep Up the good Work !  - Continue  Humalog Mix 24 units with Breakfast and Supper  - Continue Metformin 750 mg XR daily  - Continue Farxiga 10 mg daily  - Continue Ozempic 0.5 mg weekly      HOW TO TREAT LOW BLOOD SUGARS (Blood sugar LESS THAN 70 MG/DL)  Please follow the RULE OF 15 for the treatment of hypoglycemia treatment (when your (blood sugars are less than 70 mg/dL)    STEP 1: Take 15 grams of carbohydrates when your blood sugar is low, which includes:   3-4 GLUCOSE TABS  OR  3-4 OZ OF JUICE OR REGULAR SODA OR  ONE TUBE OF GLUCOSE GEL     STEP 2: RECHECK blood sugar in 15 MINUTES STEP 3: If your blood sugar is still low at the 15 minute recheck --> then, go back to STEP 1 and treat AGAIN with another 15 grams of carbohydrates.

## 2020-09-27 NOTE — Progress Notes (Signed)
Name: Jeanette Meyer  Age/ Sex: 45 y.o., female   MRN/ DOB: [id number], August 28, 1975     PCP: Marrian Salvage, [address]   Reason for Endocrinology Evaluation: Type 2 Diabetes Mellitus  Initial Endocrine Consultative Visit: 03/07/18    PATIENT IDENTIFIER: Jeanette Meyer is a 45 y.o. female with a past medical history of HTN and DM. The patient has followed with Endocrinology clinic since 03/07/18 for consultative assistance with management of her diabetes.    DIABETIC HISTORY:  Jeanette Meyer was diagnosed with T2DM at age 84. She was initially on MDI insulin regimen ~ months. With lifestyle changes and weight loss she was able to come off insulin. She was switched to oral glycemic agents at the time. Over the years she has tried Trulitcity which caused palpitations, Januvia caused joint pains. She has also tried [place] but due to a single episode of UTI patient states this was discontinued but we restarted it 10/2018. Lantus was added in August, 2019 to her regimen . Her hemoglobin A1c was 12.2% on her presentation to our clinic   Prandial insulin started 02/2018  She is intolerant to BID dosing of metformin   Switched MDI regimen to insulin mix 04/2019  Ozempic started 05/2020  SUBJECTIVE:   During the last visit (05/24/2020): A1c 9.1%. Continued metformin, farxiga and insulin mix.     Today (09/27/2020): Jeanette Meyer is here for a follow up on her diabetes management.She checks her blood sugars sporadically . The patient has had hypoglycemic episodes since the last clinic visit.    Denies nausea or diarrhea  Has been working on cutting out sodas   Hours of work 7p - [employer]:  Humalog Mix 24 units BID  Metformin 750 XR mg daily in the evening   Farxiga 10 mg daily  Ozempic 0.25 mg weekly      METER DOWNLOAD SUMMARY: Did not bring       DIABETIC COMPLICATIONS: Microvascular complications:  Retinopathy Denies: Neuropathy,  nephropathy  Last eye exam: Completed in 05/23/2018   Macrovascular complications:   Denies: CAD, CVA, PVD        HISTORY:  Past Medical History:  Past Medical History:  Diagnosis Date  . Diabetes mellitus without complication ([place])   . Fibroid   . GERD (gastroesophageal reflux disease)   . Hypertension    Past Surgical History:  Past Surgical History:  Procedure Laterality Date  . boil removal  02/2019   [facility]  . COLONOSCOPY     2002-2004  . MYOMECTOMY    . RECTAL POLYPECTOMY     2002-2004   Social History:  reports that she has never smoked. She has never used smokeless tobacco. She reports current alcohol use. She reports that she does not use drugs. Family History:  Family History  Problem Relation Age of Onset  . Diabetes Mother   . Hypertension Mother   . Diabetes Maternal Grandmother   . Cancer Maternal Grandmother   . Hypertension Maternal Grandmother   . Liver cancer Maternal Grandmother   . Crohn's disease Maternal Uncle   . Colon cancer Neg Hx   . Esophageal cancer Neg Hx      HOME MEDICATIONS: Allergies as of 09/27/2020      Reactions   Bactrim [sulfamethoxazole-trimethoprim] Diarrhea, Nausea And Vomiting   Clarithromycin Anaphylaxis   Doxycycline Anaphylaxis   Latex       Medication List  Accurate as of Sep 27, 2020  9:06 AM. If you have any questions, ask your nurse or doctor.        STOP taking these medications   Hycodan 5-1.5 MG/5ML syrup Generic drug: HYDROcodone-homatropine Stopped by: Dorita Sciara, MD     TAKE these medications   amLODipine 5 MG tablet Commonly known as: NORVASC TAKE 1 TABLET (5 MG TOTAL) BY MOUTH DAILY.   Blood Glucose Monitor System w/Device Kit Dispense Freestyle meter or per insurance coverage. Check glucose once daily - ICD 10- E11.65 What changed: additional instructions   cetirizine 10 MG tablet Commonly known as: ZYRTEC Take 10 mg by mouth daily.    ELDERBERRY PO Take by mouth daily. Uses pill and syrup   Farxiga 10 MG Tabs tablet Generic drug: dapagliflozin propanediol TAKE 1 TABLET BY MOUTH ONCE DAILY BEFORE BREAKFAST   fluconazole 150 MG tablet Commonly known as: DIFLUCAN TAKE 1 TABLET BY MOUTH EVERY 72 HOURS FOR 3 DOSES. THEN CONTINUE 1 TABLET ONCE A WEEK FOR 6 MONTHS.   glucose blood test strip Check fasting blood sugar once daily.   HumaLOG Mix 75/25 KwikPen (75-25) 100 UNIT/ML Kwikpen Generic drug: Insulin Lispro Prot & Lispro INJECT 24 UNITS INTO THE SKIN 2 (TWO) TIMES DAILY WITH A MEAL.   lisinopril 20 MG tablet Commonly known as: ZESTRIL Take 1 tablet (20 mg total) by mouth daily.   metFORMIN 750 MG 24 hr tablet Commonly known as: GLUCOPHAGE-XR Take 1 tablet (750 mg total) by mouth daily with breakfast.   metoprolol tartrate 25 MG tablet Commonly known as: LOPRESSOR TAKE 1 TABLET BY MOUTH 2 TIMES DAILY.   multivitamin tablet Take 1 tablet by mouth daily.   Ozempic (0.25 or 0.5 MG/DOSE) 2 MG/1.5ML Sopn Generic drug: Semaglutide(0.25 or 0.5MG /DOS) INJECT 0.5MG  UNDER THE SKIN ONCE A WEEK   Pen Needles 32G X 4 MM Misc 1 Device by Does not apply route 2 (two) times daily with a meal.   PreviDent 0.2 % Soln Generic drug: SODIUM FLUORIDE (DENTAL RINSE) USE AS A ORAL RINSE AS DIRECTED ON BOTTLE.   PROBIOTIC PO Take by mouth daily.   rizatriptan 10 MG tablet Commonly known as: Maxalt Take 1 tablet (10 mg total) by mouth as needed for migraine. May repeat in 2 hours if needed        OBJECTIVE:   Vital Signs: BP 134/84   Pulse 94   Ht 5\' 4"  (1.626 m)   Wt 210 lb 4 oz (95.4 kg)   LMP 08/31/2020   SpO2 99%   BMI 36.09 kg/m   Wt Readings from Last 3 Encounters:  09/27/20 210 lb 4 oz (95.4 kg)  09/13/20 217 lb (98.4 kg)  05/24/20 216 lb 6 oz (98.1 kg)     Exam: General: Pt appears well and is in NAD  Neck: General: Supple without adenopathy. Thyroid: Thyroid size normal.  No goiter or  nodules appreciated.  Lungs: Clear with good BS bilat with no rales, rhonchi, or wheezes  Heart: RRR with normal S1 and S2 and no gallops; no murmurs; no rub  Abdomen: Normoactive bowel sounds, soft, nontender, without masses or organomegaly palpable  Extremities: Trace pretibial edema.   Neuro: MS is good with appropriate affect, pt is alert and Ox3    DM Foot Exam: 05/24/2020 The skin of the feet is intact without sores or ulcerations. The pedal pulses are 2+ on right and 2+ on left. The sensation is intact to a screening 5.07, 10 gram  monofilament bilaterally    DATA REVIEWED:  Lab Results  Component Value Date   HGBA1C 8.6 (A) 09/27/2020   HGBA1C 9.1 (A) 05/24/2020   HGBA1C 9.5 (A) 01/12/2020   Lab Results  Component Value Date   MICROALBUR <0.7 04/28/2019   [place] 92 10/27/2018   CREATININE 1.13 04/28/2019      ASSESSMENT / PLAN / RECOMMENDATIONS:   1) Type 2 Diabetes Mellitus, Poorly controlled, without complications - Most recent A1c of 8.6  %. Goal A1c < 7.0%.      - I have praised the pt on improved glycemic control  - She continues to work on lifestyle changes  - She is not interested in insulin pumps  - She  has tried trulicity in the past and developed palpitations ,tolerating trulicity without changes  - Intolerant to BID dosing of metformin   MEDICATIONS:  Continue Humalog Mix 24 units With Breakfast and Supper  Continue Metformin 750 mg XR Once daily  Continue Farxiga 10 mg daily  Continue Ozempic 0.5 mg weekly   EDUCATION / INSTRUCTIONS: BG monitoring instructions: Patient is instructed to check her blood sugars 2 times a day, before breakfast and supper . Call [place] [facility] if: BG persistently < 70  I reviewed the Rule of 15 for the treatment of hypoglycemia in detail with the patient. Literature supplied.     2) Diabetic complications:  Eye: Does not have known diabetic retinopathy. Pt urged to have an eye exam  Neuro/  Feet: does not  have known diabetic peripheral neuropathy . Renal: Patient does not have known baseline CKD.  She is on an ACEI/ARB at present.    3) Lipids:   -We discussed ordering labs on her today but it appears that she left the office without any labs.  We will repeat this on next visit  F/U in 4 months    Signed electronically by: Mack Guise, MD  [facility] Endocrinology  [facility] [address] [place], [place] [address] Phone: [phone] FAX: [phone]   CC: Marrian Salvage, [place] [address] [place] [address] Phone: [phone]  Fax: [phone]  Return to Endocrinology clinic as below: Future Appointments  Date Time Provider [place]  01/16/2021  9:00 AM [facility] 1 [facility]

## 2020-09-30 ENCOUNTER — Other Ambulatory Visit (HOSPITAL_BASED_OUTPATIENT_CLINIC_OR_DEPARTMENT_OTHER): Payer: Self-pay

## 2020-09-30 MED ORDER — PFIZER-BIONT COVID-19 VAC-TRIS 30 MCG/0.3ML IM SUSP
INTRAMUSCULAR | 0 refills | Status: DC
  Filled 2020-09-30: qty 0.3, 1d supply, fill #0

## 2020-10-03 ENCOUNTER — Other Ambulatory Visit (HOSPITAL_BASED_OUTPATIENT_CLINIC_OR_DEPARTMENT_OTHER): Payer: Self-pay

## 2020-10-11 ENCOUNTER — Encounter: Payer: 59 | Admitting: Family

## 2020-10-13 ENCOUNTER — Other Ambulatory Visit: Payer: Self-pay

## 2020-10-13 ENCOUNTER — Other Ambulatory Visit (INDEPENDENT_AMBULATORY_CARE_PROVIDER_SITE_OTHER): Payer: 59

## 2020-10-13 DIAGNOSIS — E1165 Type 2 diabetes mellitus with hyperglycemia: Secondary | ICD-10-CM

## 2020-10-13 LAB — LIPID PANEL
Cholesterol: 142 mg/dL (ref 0–200)
HDL: 42.2 mg/dL (ref >39.00)
LDL Cholesterol: 85 mg/dL (ref 0–99)
NonHDL: 99.65
Total CHOL/HDL Ratio: 3
Triglycerides: 74 mg/dL (ref 0.0–149.0)
VLDL: 14.8 mg/dL (ref 0.0–40.0)

## 2020-10-13 LAB — MICROALBUMIN / CREATININE URINE RATIO
Creatinine,U: 110.9 mg/dL
Microalb Creat Ratio: 0.9 mg/g (ref 0.0–30.0)
Microalb, Ur: 1 mg/dL (ref 0.0–1.9)

## 2020-10-13 LAB — BASIC METABOLIC PANEL
BUN: 8 mg/dL (ref 6–23)
CO2: 28 mEq/L (ref 19–32)
Calcium: 9 mg/dL (ref 8.4–10.5)
Chloride: 106 mEq/L (ref 96–112)
Creatinine, Ser: 0.72 mg/dL (ref 0.40–1.20)
GFR: 101.02 mL/min (ref >60.00)
Glucose, Bld: 120 mg/dL — ABNORMAL HIGH (ref 70–99)
Potassium: 4 mEq/L (ref 3.5–5.1)
Sodium: 140 mEq/L (ref 135–145)

## 2020-10-14 ENCOUNTER — Ambulatory Visit (INDEPENDENT_AMBULATORY_CARE_PROVIDER_SITE_OTHER): Payer: 59 | Admitting: Family

## 2020-10-14 ENCOUNTER — Encounter: Payer: Self-pay | Admitting: Family

## 2020-10-14 ENCOUNTER — Other Ambulatory Visit: Payer: Self-pay

## 2020-10-14 ENCOUNTER — Other Ambulatory Visit (HOSPITAL_BASED_OUTPATIENT_CLINIC_OR_DEPARTMENT_OTHER): Payer: Self-pay

## 2020-10-14 VITALS — BP 118/62 | HR 103 | Temp 98.3°F | Ht 64.0 in | Wt 207.0 lb

## 2020-10-14 DIAGNOSIS — L600 Ingrowing nail: Secondary | ICD-10-CM

## 2020-10-14 DIAGNOSIS — E559 Vitamin D deficiency, unspecified: Secondary | ICD-10-CM

## 2020-10-14 DIAGNOSIS — M791 Myalgia, unspecified site: Secondary | ICD-10-CM

## 2020-10-14 DIAGNOSIS — E1165 Type 2 diabetes mellitus with hyperglycemia: Secondary | ICD-10-CM

## 2020-10-14 DIAGNOSIS — Z0000 Encounter for general adult medical examination without abnormal findings: Secondary | ICD-10-CM

## 2020-10-14 MED ORDER — PRAVASTATIN SODIUM 10 MG PO TABS
10.0000 mg | ORAL_TABLET | Freq: Every day | ORAL | 0 refills | Status: DC
  Filled 2020-10-14: qty 90, 90d supply, fill #0

## 2020-10-14 MED ORDER — LISINOPRIL 2.5 MG PO TABS
2.5000 mg | ORAL_TABLET | Freq: Every day | ORAL | 0 refills | Status: DC
  Filled 2020-10-14: qty 90, 90d supply, fill #0

## 2020-10-14 NOTE — Progress Notes (Signed)
Jeanette Meyer is a 45 y.o. female with the following history as recorded in [employer]:  Patient Active Problem List   Diagnosis Date Noted  . Dyslipidemia 01/27/2019  . At risk for cardiovascular event 10/28/2018  . Abscess of pubic region 04/29/2018  . Essential hypertension 01/17/2018  . Uncontrolled type 2 diabetes mellitus ([place]) 01/17/2018  . H/O rectal polypectomy 01/17/2018  . Fibroid uterus 11/06/2017  . Abnormal uterine bleeding (AUB) 11/06/2017  . Abscess of groin, left 11/06/2017    Current Outpatient Medications  Medication Sig Dispense Refill  . amLODipine (NORVASC) 5 MG tablet TAKE 1 TABLET (5 MG TOTAL) BY MOUTH DAILY. 90 tablet 0  . Blood Glucose Monitoring Suppl (BLOOD GLUCOSE MONITOR SYSTEM) w/Device KIT Dispense Freestyle meter or per insurance coverage. Check glucose once daily - ICD 10- E11.65 1 each 0  . cetirizine (ZYRTEC) 10 MG tablet Take 10 mg by mouth daily.    . dapagliflozin propanediol (FARXIGA) 10 MG TABS tablet TAKE 1 TABLET BY MOUTH ONCE DAILY BEFORE BREAKFAST 90 tablet 1  . ELDERBERRY PO Take by mouth daily. Uses pill and syrup    . glucose blood test strip Check fasting blood sugar once daily. 150 each 12  . Insulin Lispro Prot & Lispro (HUMALOG 75/25 MIX) (75-25) 100 UNIT/ML Kwikpen INJECT 24 UNITS INTO THE SKIN 2 (TWO) TIMES DAILY WITH A MEAL. 15 mL 2  . Insulin Pen Needle (PEN NEEDLES) 32G X 4 MM MISC 1 Device by Does not apply route 2 (two) times daily with a meal. 200 each 3  . lisinopril (ZESTRIL) 2.5 MG tablet Take 1 tablet (2.5 mg total) by mouth daily. 90 tablet 0  . metFORMIN (GLUCOPHAGE-XR) 750 MG 24 hr tablet Take 1 tablet (750 mg total) by mouth daily with breakfast. 90 tablet 3  . metoprolol tartrate (LOPRESSOR) 25 MG tablet TAKE 1 TABLET BY MOUTH 2 TIMES DAILY. 180 tablet 1  . Multiple Vitamin (MULTIVITAMIN) tablet Take 1 tablet by mouth daily.    . Probiotic Product (PROBIOTIC PO) Take by mouth daily.    . rizatriptan (MAXALT) 10 MG  tablet Take 1 tablet (10 mg total) by mouth as needed for migraine. May repeat in 2 hours if needed 10 tablet 1  . Semaglutide,0.25 or 0.5MG /DOS, 2 MG/1.5ML SOPN INJECT 0.5MG  UNDER THE SKIN ONCE A WEEK 1.5 mL 6  . SODIUM FLUORIDE, DENTAL RINSE, 0.2 % SOLN USE AS A ORAL RINSE AS DIRECTED ON BOTTLE. 473 mL 5  . pravastatin (PRAVACHOL) 10 MG tablet Take 1 tablet (10 mg total) by mouth daily. 90 tablet 0   No current facility-administered medications for this visit.    Allergies: Bactrim [sulfamethoxazole-trimethoprim], Clarithromycin, Doxycycline, and Latex  Past Medical History:  Diagnosis Date  . Diabetes mellitus without complication ([place])   . Fibroid   . GERD (gastroesophageal reflux disease)   . Hypertension     Past Surgical History:  Procedure Laterality Date  . boil removal  02/2019   [facility]  . COLONOSCOPY     2002-2004  . MYOMECTOMY    . RECTAL POLYPECTOMY     2002-2004    Family History  Problem Relation Age of Onset  . Diabetes Mother   . Hypertension Mother   . Diabetes Maternal Grandmother   . Cancer Maternal Grandmother   . Hypertension Maternal Grandmother   . Liver cancer Maternal Grandmother   . Crohn's disease Maternal Uncle   . Colon cancer Neg Hx   . Esophageal cancer  Neg Hx     Social History   Tobacco Use  . Smoking status: Never Smoker  . Smokeless tobacco: Never Used  Substance Use Topics  . Alcohol use: Yes    Subjective:   Presents for yearly CPE; history of hypertension/ Type 2 Diabetes; Does work with GYN and endocrinology; Requesting referral to ophthalmology for diabetic eye exam; Notes she is only taking Amlodipine and Toprol XL; has not taken Lisinopril for 2-3 weeks;   Review of Systems  Constitutional: Positive for weight loss.       Planned  HENT: Negative.   Eyes: Negative.   Respiratory: Negative.   Cardiovascular: Negative.   Gastrointestinal: Negative.   Genitourinary: Negative.   Musculoskeletal:  Positive for joint pain.  Skin: Negative.   Neurological: Negative.   Endo/Heme/Allergies: Negative.   Psychiatric/Behavioral: Negative.       Objective:  Vitals:   10/14/20 1507  BP: 118/62  Pulse: (!) 103  Temp: 98.3 F (36.8 C)  TempSrc: Oral  SpO2: 97%  Weight: 207 lb (93.9 kg)  Height: $Remove'5\' 4"'IWfoSJB$  (1.626 m)    General: Well developed, well nourished, in no acute distress  Skin : Warm and dry.  Head: Normocephalic and atraumatic  Eyes: Sclera and conjunctiva clear; pupils round and reactive to light; extraocular movements intact  Ears: External normal; canals clear; tympanic membranes normal  Oropharynx: Pink, supple. No suspicious lesions  Neck: Supple without thyromegaly, adenopathy  Lungs: Respirations unlabored; clear to auscultation bilaterally without wheeze, rales, rhonchi  CVS exam: normal rate and regular rhythm.  Abdomen: Soft; nontender; nondistended; normoactive bowel sounds; no masses or hepatosplenomegaly  Musculoskeletal: No deformities; no active joint inflammation  Extremities: No edema, cyanosis, clubbing  Vessels: Symmetric bilaterally  Neurologic: Alert and oriented; speech intact; face symmetrical; moves all extremities well; CNII-XII intact without focal deficit   Assessment:  1. PE (physical exam), annual   2. Uncontrolled type 2 diabetes mellitus with hyperglycemia ([place])   3. Ingrown toenail   4. Myalgia   5. Vitamin D deficiency     Plan:  Age appropriate preventive healthcare needs addressed; encouraged regular eye doctor and dental exams; encouraged regular exercise; will update labs and refills as needed today; follow-up to be determined; Continue to work with endocrinology; trial of Pravachol 10 mg- discussed benefit of statin for diabetic patients; change Lisinopril to 2.5 mg daily for renal protection;  Refer to ophthalmology;  Refer to podiatry; Update labs today and follow up to be determined;   This visit occurred during the SARS-CoV-2  public health emergency.  Safety protocols were in place, including screening questions prior to the visit, additional usage of staff PPE, and extensive cleaning of exam room while observing appropriate contact time as indicated for disinfecting solutions.     No follow-ups on file.  Orders Placed This Encounter  Procedures  . Antinuclear Antib (ANA)  . Sedimentation rate  . Rheumatoid Factor  . CBC with Differential/Platelet  . TSH  . Vitamin D (25 hydroxy)  . Vitamin B12  . Ambulatory referral to Ophthalmology    Referral Priority:   Routine    Referral Type:   Consultation    Referral Reason:   Specialty Services Required    Requested Specialty:   Ophthalmology    Number of Visits Requested:   1  . Ambulatory referral to Podiatry    Referral Priority:   Routine    Referral Type:   Consultation    Referral Reason:   Specialty Services  Required    Requested Specialty:   Podiatry    Number of Visits Requested:   1    Requested Prescriptions   Signed Prescriptions Disp Refills  . pravastatin (PRAVACHOL) 10 MG tablet 90 tablet 0    Sig: Take 1 tablet (10 mg total) by mouth daily.  Marland Kitchen lisinopril (ZESTRIL) 2.5 MG tablet 90 tablet 0    Sig: Take 1 tablet (2.5 mg total) by mouth daily.

## 2020-10-18 ENCOUNTER — Encounter: Payer: Self-pay | Admitting: Family

## 2020-10-18 LAB — CBC WITH DIFFERENTIAL/PLATELET
Absolute Monocytes: 577 cells/uL (ref 200–950)
Basophils Absolute: 22 cells/uL (ref 0–200)
Basophils Relative: 0.4 %
Eosinophils Absolute: 112 cells/uL (ref 15–500)
Eosinophils Relative: 2 %
HCT: 36.7 % (ref 35.0–45.0)
Hemoglobin: 11.2 g/dL — ABNORMAL LOW (ref 11.7–15.5)
Lymphs Abs: 1719 cells/uL (ref 850–3900)
MCH: 22.8 pg — ABNORMAL LOW (ref 27.0–33.0)
MCHC: 30.5 g/dL — ABNORMAL LOW (ref 32.0–36.0)
MCV: 74.6 fL — ABNORMAL LOW (ref 80.0–100.0)
MPV: 9.5 fL (ref 7.5–12.5)
Monocytes Relative: 10.3 %
Neutro Abs: 3170 cells/uL (ref 1500–7800)
Neutrophils Relative %: 56.6 %
Platelets: 483 10*3/uL — ABNORMAL HIGH (ref 140–400)
RBC: 4.92 10*6/uL (ref 3.80–5.10)
RDW: 15.8 % — ABNORMAL HIGH (ref 11.0–15.0)
Total Lymphocyte: 30.7 %
WBC: 5.6 10*3/uL (ref 3.8–10.8)

## 2020-10-18 LAB — VITAMIN B12: Vitamin B-12: 424 pg/mL (ref 200–1100)

## 2020-10-18 LAB — SEDIMENTATION RATE: Sed Rate: 25 mm/h — ABNORMAL HIGH (ref 0–20)

## 2020-10-18 LAB — VITAMIN D 25 HYDROXY (VIT D DEFICIENCY, FRACTURES): Vit D, 25-Hydroxy: 24 ng/mL — ABNORMAL LOW (ref 30–100)

## 2020-10-18 LAB — RHEUMATOID FACTOR: Rheumatoid fact SerPl-aCnc: <14 IU/mL (ref <14)

## 2020-10-18 LAB — TSH: TSH: 1.63 mIU/L

## 2020-10-18 LAB — ANA: Anti Nuclear Antibody (ANA): NEGATIVE

## 2020-10-19 ENCOUNTER — Other Ambulatory Visit (HOSPITAL_COMMUNITY): Payer: Self-pay

## 2020-10-19 ENCOUNTER — Other Ambulatory Visit: Payer: Self-pay | Admitting: Family

## 2020-10-19 MED ORDER — IRON POLYSACCH CMPLX-B12-FA 150-0.025-1 MG PO CAPS
ORAL_CAPSULE | ORAL | 0 refills | Status: DC
  Filled 2020-10-19 – 2020-11-01 (×2): qty 30, 30d supply, fill #0

## 2020-10-22 ENCOUNTER — Encounter: Payer: Self-pay | Admitting: Family

## 2020-10-26 ENCOUNTER — Encounter: Payer: Self-pay | Admitting: Podiatry

## 2020-10-26 ENCOUNTER — Other Ambulatory Visit: Payer: Self-pay

## 2020-10-26 ENCOUNTER — Ambulatory Visit (INDEPENDENT_AMBULATORY_CARE_PROVIDER_SITE_OTHER): Payer: 59 | Admitting: Podiatry

## 2020-10-26 DIAGNOSIS — L600 Ingrowing nail: Secondary | ICD-10-CM

## 2020-10-26 NOTE — Patient Instructions (Signed)

## 2020-10-26 NOTE — Progress Notes (Signed)
Subjective:   Patient ID: Jeanette Meyer, female   DOB: 45 y.o.   MRN: [id number]   HPI Patient states she is doing a lot better with her diabetes and she continues to have these chronic ingrown toenails and she is worried because she digs at them herself and she is created periodic infections over the years   ROS      Objective:  Physical Exam  Neurovascular status found to be intact with excellent circulatory status with good digital perfusion.  Medial border of the hallux bilateral both incurvated and they are slightly red and sore right over left.  Patient has obvious deformity of the nailbed itself     Assessment:  Ingrown toenail deformity hallux bilateral medial that I am very worried that she continues to digging on and will eventually create a significant infective process     Plan:  H&P reviewed condition.  I recommended correction of the ingrown toenails and I did explain risk with this and the fact that her sugar while improving still is slightly high but her circulation is excellent and she should heal well from this and I think the risk of her continue to work on them herself is higher.  At this point she understands and after review she signed consent form understanding the risk and I infiltrated each hallux 60 mg like Marcaine mixture sterile prep done and using sterile instrumentation remove the medial borders exposed matrix applied phenol 3 applications 30 seconds followed by alcohol lavage sterile dressing gave instructions on soaks leave dressing on 24 hours take off earlier if any throbbing were to occur

## 2020-10-27 ENCOUNTER — Other Ambulatory Visit (HOSPITAL_COMMUNITY): Payer: Self-pay

## 2020-10-31 ENCOUNTER — Encounter: Payer: Self-pay | Admitting: Family

## 2020-10-31 ENCOUNTER — Other Ambulatory Visit (HOSPITAL_BASED_OUTPATIENT_CLINIC_OR_DEPARTMENT_OTHER): Payer: Self-pay

## 2020-10-31 MED FILL — Dapagliflozin Propanediol Tab 10 MG (Base Equivalent): ORAL | 30 days supply | Qty: 30 | Fill #1 | Status: AC

## 2020-11-01 ENCOUNTER — Other Ambulatory Visit (HOSPITAL_COMMUNITY): Payer: Self-pay

## 2020-11-01 ENCOUNTER — Encounter: Payer: Self-pay | Admitting: Podiatry

## 2020-11-05 MED FILL — Insulin Lispro Prot & Lispro Sus Pen-inj 100 Unit/ML (75-25): SUBCUTANEOUS | 31 days supply | Qty: 15 | Fill #1 | Status: AC

## 2020-11-07 ENCOUNTER — Other Ambulatory Visit (HOSPITAL_COMMUNITY): Payer: Self-pay

## 2020-11-22 ENCOUNTER — Other Ambulatory Visit: Payer: Self-pay | Admitting: Internal Medicine

## 2020-11-22 ENCOUNTER — Other Ambulatory Visit (HOSPITAL_BASED_OUTPATIENT_CLINIC_OR_DEPARTMENT_OTHER): Payer: Self-pay

## 2020-11-22 MED FILL — Semaglutide Soln Pen-inj 0.25 or 0.5 MG/DOSE (2 MG/1.5ML): SUBCUTANEOUS | 84 days supply | Qty: 4.5 | Fill #1 | Status: CN

## 2020-11-23 ENCOUNTER — Other Ambulatory Visit (HOSPITAL_BASED_OUTPATIENT_CLINIC_OR_DEPARTMENT_OTHER): Payer: Self-pay

## 2020-11-25 ENCOUNTER — Other Ambulatory Visit: Payer: Self-pay | Admitting: Family

## 2020-11-25 ENCOUNTER — Other Ambulatory Visit (HOSPITAL_BASED_OUTPATIENT_CLINIC_OR_DEPARTMENT_OTHER): Payer: Self-pay

## 2020-11-25 ENCOUNTER — Other Ambulatory Visit (HOSPITAL_COMMUNITY): Payer: Self-pay

## 2020-11-25 MED ORDER — PRAVASTATIN SODIUM 10 MG PO TABS
10.0000 mg | ORAL_TABLET | Freq: Every day | ORAL | 0 refills | Status: DC
  Filled 2020-11-25 – 2020-12-01 (×2): qty 90, 90d supply, fill #0

## 2020-11-25 MED FILL — Dapagliflozin Propanediol Tab 10 MG (Base Equivalent): ORAL | 30 days supply | Qty: 30 | Fill #2 | Status: AC

## 2020-11-29 ENCOUNTER — Other Ambulatory Visit (HOSPITAL_BASED_OUTPATIENT_CLINIC_OR_DEPARTMENT_OTHER): Payer: Self-pay

## 2020-11-30 ENCOUNTER — Other Ambulatory Visit (HOSPITAL_BASED_OUTPATIENT_CLINIC_OR_DEPARTMENT_OTHER): Payer: Self-pay

## 2020-12-01 ENCOUNTER — Encounter: Payer: Self-pay | Admitting: Family

## 2020-12-01 ENCOUNTER — Other Ambulatory Visit (HOSPITAL_BASED_OUTPATIENT_CLINIC_OR_DEPARTMENT_OTHER): Payer: Self-pay

## 2020-12-01 MED FILL — Semaglutide Soln Pen-inj 0.25 or 0.5 MG/DOSE (2 MG/1.5ML): SUBCUTANEOUS | 30 days supply | Qty: 1.5 | Fill #1 | Status: AC

## 2020-12-06 ENCOUNTER — Other Ambulatory Visit (HOSPITAL_BASED_OUTPATIENT_CLINIC_OR_DEPARTMENT_OTHER): Payer: Self-pay

## 2020-12-07 ENCOUNTER — Other Ambulatory Visit (HOSPITAL_COMMUNITY): Payer: Self-pay

## 2020-12-19 ENCOUNTER — Other Ambulatory Visit (HOSPITAL_COMMUNITY): Payer: Self-pay

## 2020-12-19 ENCOUNTER — Encounter: Payer: Self-pay | Admitting: Family

## 2020-12-19 NOTE — Telephone Encounter (Signed)
Pt was last seen on 10/14/20. Please confirm if we are able to send the rx to pt pharmacy.

## 2020-12-20 ENCOUNTER — Other Ambulatory Visit (HOSPITAL_COMMUNITY): Payer: Self-pay

## 2020-12-20 ENCOUNTER — Other Ambulatory Visit: Payer: Self-pay | Admitting: Family

## 2020-12-20 MED ORDER — FREESTYLE LIBRE 2 READER DEVI
3 refills | Status: AC
  Filled 2020-12-20: qty 1, 1d supply, fill #0
  Filled 2021-01-19: qty 1, 1d supply, fill #1

## 2020-12-20 MED ORDER — FREESTYLE LIBRE 2 SENSOR MISC
3 refills | Status: DC
  Filled 2020-12-20: qty 2, 28d supply, fill #0
  Filled 2021-01-09: qty 2, 28d supply, fill #1

## 2020-12-22 ENCOUNTER — Other Ambulatory Visit: Payer: Self-pay | Admitting: Internal Medicine

## 2020-12-22 ENCOUNTER — Other Ambulatory Visit: Payer: Self-pay

## 2020-12-22 ENCOUNTER — Other Ambulatory Visit (HOSPITAL_COMMUNITY): Payer: Self-pay

## 2020-12-22 ENCOUNTER — Other Ambulatory Visit (HOSPITAL_BASED_OUTPATIENT_CLINIC_OR_DEPARTMENT_OTHER): Payer: Self-pay

## 2020-12-22 MED ORDER — INSULIN LISPRO PROT & LISPRO (75-25 MIX) 100 UNIT/ML KWIKPEN
24.0000 [IU] | PEN_INJECTOR | Freq: Two times a day (BID) | SUBCUTANEOUS | 2 refills | Status: DC
  Filled 2020-12-22: qty 15, 30d supply, fill #0
  Filled 2021-01-19: qty 15, 30d supply, fill #1
  Filled 2021-02-09 – 2021-02-13 (×2): qty 15, 30d supply, fill #2

## 2020-12-22 MED ORDER — OZEMPIC (0.25 OR 0.5 MG/DOSE) 2 MG/1.5ML ~~LOC~~ SOPN
PEN_INJECTOR | SUBCUTANEOUS | 6 refills | Status: DC
  Filled 2020-12-22 – 2021-01-11 (×2): qty 1.5, 28d supply, fill #0

## 2020-12-30 ENCOUNTER — Other Ambulatory Visit: Payer: Self-pay | Admitting: Internal Medicine

## 2020-12-30 ENCOUNTER — Other Ambulatory Visit (HOSPITAL_BASED_OUTPATIENT_CLINIC_OR_DEPARTMENT_OTHER): Payer: Self-pay

## 2020-12-30 ENCOUNTER — Other Ambulatory Visit: Payer: Self-pay | Admitting: Family

## 2020-12-30 MED ORDER — POLY-IRON 150 FORTE 150-25-1 MG-MCG-MG PO CAPS
ORAL_CAPSULE | ORAL | 0 refills | Status: AC
  Filled 2020-12-30: qty 100, 100d supply, fill #0

## 2020-12-30 MED ORDER — AMLODIPINE BESYLATE 5 MG PO TABS
ORAL_TABLET | Freq: Every day | ORAL | 0 refills | Status: DC
  Filled 2020-12-30: qty 90, 90d supply, fill #0

## 2020-12-30 MED FILL — Dapagliflozin Propanediol Tab 10 MG (Base Equivalent): ORAL | 90 days supply | Qty: 90 | Fill #0 | Status: CN

## 2021-01-02 ENCOUNTER — Other Ambulatory Visit (HOSPITAL_BASED_OUTPATIENT_CLINIC_OR_DEPARTMENT_OTHER): Payer: Self-pay

## 2021-01-02 MED FILL — Dapagliflozin Propanediol Tab 10 MG (Base Equivalent): ORAL | 90 days supply | Qty: 90 | Fill #0 | Status: AC

## 2021-01-09 ENCOUNTER — Other Ambulatory Visit (HOSPITAL_COMMUNITY): Payer: Self-pay

## 2021-01-11 ENCOUNTER — Other Ambulatory Visit (HOSPITAL_COMMUNITY): Payer: Self-pay

## 2021-01-11 DIAGNOSIS — M79641 Pain in right hand: Secondary | ICD-10-CM | POA: Diagnosis not present

## 2021-01-11 DIAGNOSIS — M79642 Pain in left hand: Secondary | ICD-10-CM | POA: Diagnosis not present

## 2021-01-11 MED ORDER — NAPROXEN 500 MG PO TABS
500.0000 mg | ORAL_TABLET | Freq: Two times a day (BID) | ORAL | 1 refills | Status: DC | PRN
  Filled 2021-01-11: qty 60, 30d supply, fill #0
  Filled 2021-02-20: qty 60, 30d supply, fill #1

## 2021-01-17 ENCOUNTER — Encounter: Payer: Self-pay | Admitting: Internal Medicine

## 2021-01-17 ENCOUNTER — Other Ambulatory Visit (HOSPITAL_COMMUNITY): Payer: Self-pay

## 2021-01-17 MED ORDER — FREESTYLE LIBRE 2 SENSOR MISC
1.0000 | 6 refills | Status: DC
  Filled 2021-01-17: qty 2, fill #0
  Filled 2021-01-19: qty 1, 14d supply, fill #0
  Filled 2021-02-26: qty 1, 14d supply, fill #1
  Filled 2021-03-06: qty 2, 28d supply, fill #1
  Filled 2021-04-06: qty 2, 28d supply, fill #2
  Filled 2021-05-08: qty 2, 28d supply, fill #3
  Filled 2021-06-19: qty 2, 28d supply, fill #4
  Filled 2021-07-21: qty 2, 28d supply, fill #5
  Filled 2021-08-18: qty 2, 28d supply, fill #6
  Filled 2021-09-16: qty 2, 28d supply, fill #7

## 2021-01-19 ENCOUNTER — Other Ambulatory Visit (HOSPITAL_COMMUNITY): Payer: Self-pay

## 2021-01-19 DIAGNOSIS — G5602 Carpal tunnel syndrome, left upper limb: Secondary | ICD-10-CM | POA: Diagnosis not present

## 2021-01-20 DIAGNOSIS — G5602 Carpal tunnel syndrome, left upper limb: Secondary | ICD-10-CM | POA: Diagnosis not present

## 2021-01-20 DIAGNOSIS — M79642 Pain in left hand: Secondary | ICD-10-CM | POA: Diagnosis not present

## 2021-01-20 DIAGNOSIS — M79641 Pain in right hand: Secondary | ICD-10-CM | POA: Diagnosis not present

## 2021-01-28 ENCOUNTER — Telehealth: Payer: 59 | Admitting: Nurse Practitioner

## 2021-01-28 ENCOUNTER — Encounter: Payer: Self-pay | Admitting: Nurse Practitioner

## 2021-01-28 DIAGNOSIS — B373 Candidiasis of vulva and vagina: Secondary | ICD-10-CM | POA: Diagnosis not present

## 2021-01-28 DIAGNOSIS — B3731 Acute candidiasis of vulva and vagina: Secondary | ICD-10-CM

## 2021-01-28 MED ORDER — FLUCONAZOLE 150 MG PO TABS: 150.0000 mg | ORAL_TABLET | Freq: Once | ORAL | 1 refills | Status: AC

## 2021-01-28 NOTE — Progress Notes (Signed)
Virtual Visit Consent   TEREN FRANCKOWIAK, you are scheduled for a virtual visit with a [facility] provider today.     Just as with appointments in the office, your consent must be obtained to participate.  Your consent will be active for this visit and any virtual visit you may have with one of our providers in the next 365 days.     If you have a MyChart account, a copy of this consent can be sent to you electronically.  All virtual visits are billed to your insurance company just like a traditional visit in the office.    As this is a virtual visit, video technology does not allow for your provider to perform a traditional examination.  This may limit your provider's ability to fully assess your condition.  If your provider identifies any concerns that need to be evaluated in person or the need to arrange testing (such as labs, EKG, etc.), we will make arrangements to do so.     Although advances in technology are sophisticated, we cannot ensure that it will always work on either your end or our end.  If the connection with a video visit is poor, the visit may have to be switched to a telephone visit.  With either a video or telephone visit, we are not always able to ensure that we have a secure connection.     I need to obtain your verbal consent now.   Are you willing to proceed with your visit today?    [name] has provided verbal consent on 01/28/2021 for a virtual visit (video or telephone).   Apolonio Schneiders, FNP   Date: 01/28/2021 10:35 AM   Virtual Visit via Video Note   I, Apolonio Schneiders, connected with  Jeanette Meyer  ([id number], 01/18/1976) on 01/28/21 at 10:45 AM EDT by a video-enabled telemedicine application and verified that I am speaking with the correct person using two identifiers.  Location: Patient: Virtual Visit Location Patient: Home Provider: Virtual Visit Location Provider: Office/Clinic   I discussed the limitations of evaluation and management by  telemedicine and the availability of in person appointments. The patient expressed understanding and agreed to proceed.    History of Present Illness: Jeanette Meyer is a 45 y.o. who identifies as a female who was assigned female at birth, and is being seen today with complaints of vaginal yeast infection symptoms. She had a steroid injection earlier this month that caused her blood glucose levels to raise. She monitors glucose levels at home and is an insulin dependent diabetic. She is now having vaginal yeast symptoms, this has happened in the past and she is familiar with symptoms. Sees an OBGYN typically for treatment    Problems:  Patient Active Problem List   Diagnosis Date Noted   Dyslipidemia 01/27/2019   At risk for cardiovascular event 10/28/2018   Abscess of pubic region 04/29/2018   Essential hypertension 01/17/2018   Uncontrolled type 2 diabetes mellitus ([place]) 01/17/2018   H/O rectal polypectomy 01/17/2018   Fibroid uterus 11/06/2017   Abnormal uterine bleeding (AUB) 11/06/2017   Abscess of groin, left 11/06/2017    Allergies:  Allergies  Allergen Reactions   Bactrim [Sulfamethoxazole-Trimethoprim] Diarrhea and Nausea And Vomiting   Clarithromycin Anaphylaxis   Doxycycline Anaphylaxis   Latex    Current Outpatient Medications  Medication Instructions   amLODipine (NORVASC) 5 MG tablet TAKE 1 TABLET (5 MG TOTAL) BY MOUTH DAILY.   Blood Glucose Monitoring Suppl (BLOOD  GLUCOSE MONITOR SYSTEM) w/Device KIT Dispense Freestyle meter or per insurance coverage. Check glucose once daily - ICD 10- E11.65   cetirizine (ZYRTEC) 10 mg, Oral, Daily   Continuous Blood Gluc Receiver (FREESTYLE LIBRE 2 READER) DEVI Use as directed to check blood sugar   Continuous Blood Gluc Sensor (FREESTYLE LIBRE 2 SENSOR) MISC Use as directed every 14 (fourteen) days.   dapagliflozin propanediol (FARXIGA) 10 MG TABS tablet TAKE 1 TABLET BY MOUTH ONCE DAILY BEFORE BREAKFAST   ELDERBERRY PO Oral,  Daily, Uses pill and syrup    fluconazole (DIFLUCAN) 150 mg, Oral,  Once, Repeat after 72 hours up to 3 times as needed for yeast symptoms   glucose blood test strip Check fasting blood sugar once daily.   HYCODAN 5-1.5 MG/5ML syrup No dose, route, or frequency recorded.   ID NOW COVID-19 KIT TEST AS DIRECTED TODAY   Insulin Lispro Prot & Lispro (HUMALOG MIX 75/25 KWIKPEN) (75-25) 100 UNIT/ML Kwikpen 24 Units, Subcutaneous, 2 times daily with meals   Insulin Pen Needle (PEN NEEDLES) 32G X 4 MM MISC 1 Device, Does not apply, 2 times daily with meals   Iron Polysacch Cmplx-B12-FA (POLY-IRON 150 FORTE) 150-0.025-1 MG CAPS Take daily as directed   lisinopril (ZESTRIL) 2.5 mg, Oral, Daily   metFORMIN (GLUCOPHAGE-XR) 750 mg, Oral, Daily with breakfast   metoprolol tartrate (LOPRESSOR) 25 MG tablet TAKE 1 TABLET BY MOUTH 2 TIMES DAILY.   Multiple Vitamin (MULTIVITAMIN) tablet 1 tablet, Oral, Daily   naproxen (NAPROSYN) 500 mg, Oral, 2 times daily PRN   Probiotic Product (PROBIOTIC PO) Oral, Daily   rizatriptan (MAXALT) 10 mg, Oral, As needed, May repeat in 2 hours if needed   Semaglutide,0.25 or 0.5MG /DOS, (OZEMPIC, 0.25 OR 0.5 MG/DOSE,) 2 MG/1.5ML SOPN INJECT 0.5MG  UNDER THE SKIN ONCE A WEEK   SODIUM FLUORIDE, DENTAL RINSE, 0.2 % SOLN USE AS A ORAL RINSE AS DIRECTED ON BOTTLE.     Observations/Objective: Patient is well-developed, well-nourished in no acute distress.  Resting comfortably at home.  Head is normocephalic, atraumatic.  No labored breathing.  Speech is clear and coherent with logical content.  Patient is alert and oriented at baseline.    Assessment and Plan: 1. Vaginal yeast infection  - fluconazole (DIFLUCAN) 150 MG tablet; Take 1 tablet (150 mg total) by mouth once for 1 dose. Repeat after 72 hours up to 3 times as needed for yeast symptoms  Dispense: 3 tablet; Refill: 1     Follow Up Instructions: I discussed the assessment and treatment plan with the patient. The  patient was provided an opportunity to ask questions and all were answered. The patient agreed with the plan and demonstrated an understanding of the instructions.  A copy of instructions were sent to the patient via MyChart.  The patient was advised to call back or seek an in-person evaluation if the symptoms worsen or if the condition fails to improve as anticipated.  Time:  I spent 10 minutes with the patient via telehealth technology discussing the above problems/concerns.    Apolonio Schneiders, FNP

## 2021-02-01 DIAGNOSIS — H5213 Myopia, bilateral: Secondary | ICD-10-CM | POA: Diagnosis not present

## 2021-02-01 LAB — HM DIABETES EYE EXAM

## 2021-02-07 ENCOUNTER — Other Ambulatory Visit (HOSPITAL_BASED_OUTPATIENT_CLINIC_OR_DEPARTMENT_OTHER): Payer: Self-pay

## 2021-02-07 ENCOUNTER — Other Ambulatory Visit: Payer: Self-pay

## 2021-02-07 ENCOUNTER — Ambulatory Visit (INDEPENDENT_AMBULATORY_CARE_PROVIDER_SITE_OTHER): Payer: 59 | Admitting: Family

## 2021-02-07 ENCOUNTER — Ambulatory Visit: Payer: 59 | Admitting: Internal Medicine

## 2021-02-07 ENCOUNTER — Encounter: Payer: Self-pay | Admitting: Internal Medicine

## 2021-02-07 VITALS — BP 126/80 | HR 95 | Ht 64.0 in | Wt 214.4 lb

## 2021-02-07 DIAGNOSIS — E1165 Type 2 diabetes mellitus with hyperglycemia: Secondary | ICD-10-CM | POA: Diagnosis not present

## 2021-02-07 LAB — POCT GLYCOSYLATED HEMOGLOBIN (HGB A1C): Hemoglobin A1C: 7.9 % — AB (ref 4.0–5.6)

## 2021-02-07 MED ORDER — DAPAGLIFLOZIN PROPANEDIOL 10 MG PO TABS
10.0000 mg | ORAL_TABLET | Freq: Every day | ORAL | 3 refills | Status: DC
  Filled 2021-02-07 – 2021-04-30 (×2): qty 90, 90d supply, fill #0

## 2021-02-07 MED ORDER — METFORMIN HCL ER 750 MG PO TB24
750.0000 mg | ORAL_TABLET | Freq: Every day | ORAL | 3 refills | Status: DC
  Filled 2021-02-07: qty 90, 90d supply, fill #0
  Filled 2021-11-06: qty 90, 90d supply, fill #1

## 2021-02-07 MED ORDER — OZEMPIC (1 MG/DOSE) 4 MG/3ML ~~LOC~~ SOPN
1.0000 mg | PEN_INJECTOR | SUBCUTANEOUS | 6 refills | Status: DC
  Filled 2021-02-07: qty 9, 84d supply, fill #0
  Filled 2021-05-01: qty 9, 84d supply, fill #1
  Filled 2021-07-29: qty 3, 28d supply, fill #2

## 2021-02-07 NOTE — Progress Notes (Signed)
Name: Jeanette Meyer  Age/ Sex: 45 y.o., female   MRN/ DOB: [id number], 1976/04/13     PCP: Marrian Salvage, [place]   Reason for Endocrinology Evaluation: Type 2 Diabetes Mellitus  Initial Endocrine Consultative Visit: 03/07/18    PATIENT IDENTIFIER: Ms. Jeanette Meyer is a 45 y.o. female with a past medical history of HTN and DM. The patient has followed with Endocrinology clinic since 03/07/18 for consultative assistance with management of her diabetes.    DIABETIC HISTORY:  Jeanette Meyer was diagnosed with T2DM at age 52. She was initially on MDI insulin regimen ~ months. With lifestyle changes and weight loss she was able to come off insulin. She was switched to oral glycemic agents at the time. Over the years she has tried Trulitcity which caused palpitations, Januvia caused joint pains. She has also tried [place] but due to a single episode of UTI patient states this was discontinued but we restarted it 10/2018. Lantus was added in August, 2019 to her regimen . Her hemoglobin A1c was 12.2% on her presentation to our clinic   Prandial insulin started 02/2018  She is intolerant to BID dosing of metformin   Switched MDI regimen to insulin mix 04/2019  Ozempic started 05/2020  SUBJECTIVE:   During the last visit (09/27/2020): A1c 8.6 %. Continued metformin, farxiga and insulin mix.     Today (02/07/2021): Jeanette Meyer is here for a follow up on her diabetes management.She checks her blood sugars multiple times through CGM . The patient has  had hypoglycemic episodes since the last clinic visit, occur while sleeping     Denies nausea or diarrhea  Has been working on cutting out sodas  Has a left wrist intra-articular injection 01/20/2021   Hours of work 7p - [place]:  Humalog Mix 24 units BID  Metformin 750 XR mg daily in the evening   Farxiga 10 mg daily  Ozempic 0.5 mg weekly      CONTINUOUS GLUCOSE MONITORING RECORD  INTERPRETATION Unable to download       DIABETIC COMPLICATIONS: Microvascular complications:  Retinopathy Denies: Neuropathy, nephropathy  Last eye exam: Completed in 05/23/2018     Macrovascular complications:    Denies: CAD, CVA, PVD        HISTORY:  Past Medical History:  Past Medical History:  Diagnosis Date   Diabetes mellitus without complication ([place])    Fibroid    GERD (gastroesophageal reflux disease)    Hypertension    Past Surgical History:  Past Surgical History:  Procedure Laterality Date   boil removal  02/2019   [facility]   COLONOSCOPY     2002-2004   MYOMECTOMY     RECTAL POLYPECTOMY     2002-2004   Social History:  reports that she has never smoked. She has never used smokeless tobacco. She reports current alcohol use. She reports that she does not use drugs. Family History:  Family History  Problem Relation Age of Onset   Diabetes Mother    Hypertension Mother    Diabetes Maternal Grandmother    Cancer Maternal Grandmother    Hypertension Maternal Grandmother    Liver cancer Maternal Grandmother    Crohn's disease Maternal Uncle    Colon cancer Neg Hx    Esophageal cancer Neg Hx      HOME MEDICATIONS: Allergies as of 02/07/2021       Reactions   Bactrim [sulfamethoxazole-trimethoprim] Diarrhea,  Nausea And Vomiting   Clarithromycin Anaphylaxis   Doxycycline Anaphylaxis   Latex         Medication List        Accurate as of February 07, 2021  8:41 AM. If you have any questions, ask your nurse or doctor.          STOP taking these medications    Hycodan 5-1.5 MG/5ML syrup Generic drug: HYDROcodone bit-homatropine Stopped by: Dorita Sciara, MD   ID Now COVID-19 Kit Generic drug: COVID-19 Test Stopped by: Dorita Sciara, MD       TAKE these medications    amLODipine 5 MG tablet Commonly known as: NORVASC TAKE 1 TABLET (5 MG TOTAL) BY MOUTH DAILY.   Blood Glucose Monitor System  w/Device Kit Dispense Freestyle meter or per insurance coverage. Check glucose once daily - ICD 10- E11.65   cetirizine 10 MG tablet Commonly known as: ZYRTEC Take 10 mg by mouth daily.   ELDERBERRY PO Take by mouth daily. Uses pill and syrup   Farxiga 10 MG Tabs tablet Generic drug: dapagliflozin propanediol TAKE 1 TABLET BY MOUTH ONCE DAILY BEFORE BREAKFAST   FreeStyle Libre 2 Reader [facility] Use as directed to check blood sugar   FreeStyle Libre 2 Sensor Misc Use as directed every 14 (fourteen) days.   glucose blood test strip Check fasting blood sugar once daily.   HumaLOG Mix 75/25 KwikPen (75-25) 100 UNIT/ML Kwikpen Generic drug: Insulin Lispro Prot & Lispro Inject 24 Units into the skin 2 (two) times daily with a meal.   lisinopril 2.5 MG tablet Commonly known as: Zestril Take 1 tablet (2.5 mg total) by mouth daily.   metFORMIN 750 MG 24 hr tablet Commonly known as: GLUCOPHAGE-XR Take 1 tablet (750 mg total) by mouth daily with breakfast.   metoprolol tartrate 25 MG tablet Commonly known as: LOPRESSOR TAKE 1 TABLET BY MOUTH 2 TIMES DAILY.   multivitamin tablet Take 1 tablet by mouth daily.   naproxen 500 MG tablet Commonly known as: NAPROSYN Take 1 tablet (500 mg total) by mouth 2 (two) times daily as needed.   Ozempic (0.25 or 0.5 MG/DOSE) 2 MG/1.5ML Sopn Generic drug: Semaglutide(0.25 or 0.5MG /DOS) INJECT 0.5MG  UNDER THE SKIN ONCE A WEEK   Pen Needles 32G X 4 MM Misc 1 Device by Does not apply route 2 (two) times daily with a meal.   Poly-Iron 150 Forte 150-0.025-1 MG Caps Generic drug: Iron Polysacch Cmplx-B12-FA Take daily as directed   PreviDent 0.2 % Soln Generic drug: SODIUM FLUORIDE (DENTAL RINSE) USE AS A ORAL RINSE AS DIRECTED ON BOTTLE.   PROBIOTIC PO Take by mouth daily.   rizatriptan 10 MG tablet Commonly known as: Maxalt Take 1 tablet (10 mg total) by mouth as needed for migraine. May repeat in 2 hours if needed          OBJECTIVE:   Vital Signs: BP 126/80 (BP Location: Left Arm, Patient Position: Sitting, Cuff Size: Small)   Pulse 95   Ht 5\' 4"  (1.626 m)   Wt 214 lb 6.4 oz (97.3 kg)   SpO2 99%   BMI 36.80 kg/m   Wt Readings from Last 3 Encounters:  02/07/21 214 lb 6.4 oz (97.3 kg)  10/14/20 207 lb (93.9 kg)  09/27/20 210 lb 4 oz (95.4 kg)     Exam: General: Pt appears well and is in NAD  Neck: General: Supple without adenopathy. Thyroid: Thyroid size normal.  No goiter or nodules appreciated.  Lungs: Clear  with good BS bilat with no rales, rhonchi, or wheezes  Heart: RRR with normal S1 and S2 and no gallops; no murmurs; no rub  Abdomen: Normoactive bowel sounds, soft, nontender, without masses or organomegaly palpable  Extremities: Trace pretibial edema.   Neuro: MS is good with appropriate affect, pt is alert and Ox3    DM Foot Exam: 05/24/2020 The skin of the feet is intact without sores or ulcerations. The pedal pulses are 2+ on right and 2+ on left. The sensation is intact to a screening 5.07, 10 gram monofilament bilaterally    DATA REVIEWED:  Lab Results  Component Value Date   HGBA1C 7.9 (A) 02/07/2021   HGBA1C 8.6 (A) 09/27/2020   HGBA1C 9.1 (A) 05/24/2020   Lab Results  Component Value Date   MICROALBUR 1.0 10/13/2020   [place] 85 10/13/2020   CREATININE 0.72 10/13/2020      ASSESSMENT / PLAN / RECOMMENDATIONS:   1) Type 2 Diabetes Mellitus, With improving Glycemic control , without complications - Most recent A1c of 7.9   %. Goal A1c < 7.0%.       - I have praised the pt on improved glycemic control  - She continues to work on lifestyle changes  - She is not interested in insulin pumps  - She  has tried trulicity in the past and developed palpitations ,tolerating Ozempic  without side effects , will increase  - Intolerant to BID dosing of metformin  - Due to hypoglycemia during sleep, will adjust insulin as below   MEDICATIONS:  Change  Humalog Mix 20  units With Breakfast and 24 units with Supper  Continue Metformin 750 mg XR Once daily  Continue Farxiga 10 mg daily  Increase  Ozempic 1 mg weekly        EDUCATION / INSTRUCTIONS: BG monitoring instructions: Patient is instructed to check her blood sugars 2 times a day, before breakfast and supper . Call [facility] if: BG persistently < 70  I reviewed the Rule of 15 for the treatment of hypoglycemia in detail with the patient. Literature supplied.      2) Diabetic complications:  Eye: Does not have known diabetic retinopathy. Pt urged to have an eye exam  Neuro/ Feet: does not  have known diabetic peripheral neuropathy . Renal: Patient does not have known baseline CKD.  She is on an ACEI/ARB at present.     3) Dyslipidemia:  -She is not on statins due to joint pains, she was on Atorvastatin and pravastatin which helped with resolution of joint pain. - Discussed cardiovascular benefits   Medication  Restart Pravastatin 10 mg once weekly       F/U in 4 months    Signed electronically by: Mack Guise, MD  [facility]  [address] [place], [place] [address] Phone: [phone] FAX: [phone]   CC: Marrian Salvage, [address] [place] [address] Phone: [phone]  Fax: [phone]  Return to Endocrinology clinic as below: Future Appointments  Date Time Provider [place]  02/23/2021  8:20 AM [facility] 1 [facility]

## 2021-02-07 NOTE — Progress Notes (Signed)
Eye exam on 02/01/21

## 2021-02-07 NOTE — Patient Instructions (Signed)
-   keep Up the good Work !  - Change  Humalog Mix 20 units with Breakfast and 24 units with Supper  - Continue Metformin 750 mg XR daily  - Continue Farxiga 10 mg daily  - Increase Ozempic to 1  mg weekly     HOW TO TREAT LOW BLOOD SUGARS (Blood sugar LESS THAN 70 MG/DL) Please follow the RULE OF 15 for the treatment of hypoglycemia treatment (when your (blood sugars are less than 70 mg/dL)   STEP 1: Take 15 grams of carbohydrates when your blood sugar is low, which includes:  3-4 GLUCOSE TABS  OR 3-4 OZ OF JUICE OR REGULAR SODA OR ONE TUBE OF GLUCOSE GEL    STEP 2: RECHECK blood sugar in 15 MINUTES STEP 3: If your blood sugar is still low at the 15 minute recheck --> then, go back to STEP 1 and treat AGAIN with another 15 grams of carbohydrates.

## 2021-02-09 ENCOUNTER — Other Ambulatory Visit (HOSPITAL_BASED_OUTPATIENT_CLINIC_OR_DEPARTMENT_OTHER): Payer: Self-pay

## 2021-02-13 ENCOUNTER — Other Ambulatory Visit (HOSPITAL_BASED_OUTPATIENT_CLINIC_OR_DEPARTMENT_OTHER): Payer: Self-pay

## 2021-02-21 ENCOUNTER — Other Ambulatory Visit (HOSPITAL_COMMUNITY): Payer: Self-pay

## 2021-02-23 ENCOUNTER — Other Ambulatory Visit (HOSPITAL_COMMUNITY): Payer: Self-pay

## 2021-02-23 ENCOUNTER — Ambulatory Visit
Admission: RE | Admit: 2021-02-23 | Discharge: 2021-02-23 | Disposition: A | Discharge status: Home / Self Care | Payer: 59 | Source: Ambulatory Visit | Attending: Family | Admitting: Family

## 2021-02-23 ENCOUNTER — Other Ambulatory Visit: Payer: Self-pay | Admitting: Family

## 2021-02-23 ENCOUNTER — Other Ambulatory Visit: Payer: Self-pay

## 2021-02-23 DIAGNOSIS — N6489 Other specified disorders of breast: Secondary | ICD-10-CM

## 2021-02-23 DIAGNOSIS — R928 Other abnormal and inconclusive findings on diagnostic imaging of breast: Secondary | ICD-10-CM | POA: Diagnosis not present

## 2021-02-23 DIAGNOSIS — N6001 Solitary cyst of right breast: Secondary | ICD-10-CM | POA: Diagnosis not present

## 2021-02-23 MED ORDER — LISINOPRIL 2.5 MG PO TABS
2.5000 mg | ORAL_TABLET | Freq: Every day | ORAL | 0 refills | Status: DC
  Filled 2021-02-23 – 2021-03-06 (×2): qty 90, 90d supply, fill #0

## 2021-02-23 MED FILL — Sodium Fluoride Rinse 0.2%: OROMUCOSAL | 30 days supply | Qty: 473 | Fill #0 | Status: AC

## 2021-02-27 ENCOUNTER — Other Ambulatory Visit (HOSPITAL_COMMUNITY): Payer: Self-pay

## 2021-03-03 ENCOUNTER — Other Ambulatory Visit (HOSPITAL_COMMUNITY): Payer: Self-pay

## 2021-03-06 ENCOUNTER — Other Ambulatory Visit (HOSPITAL_COMMUNITY): Payer: Self-pay

## 2021-03-06 MED ORDER — NEOMYCIN-POLYMYXIN-DEXAMETH 3.5-10000-0.1 OP OINT
TOPICAL_OINTMENT | OPHTHALMIC | 0 refills | Status: DC
  Filled 2021-03-06: qty 3.5, 14d supply, fill #0

## 2021-03-07 ENCOUNTER — Other Ambulatory Visit (HOSPITAL_COMMUNITY): Payer: Self-pay

## 2021-03-07 DIAGNOSIS — Z760 Encounter for issue of repeat prescription: Secondary | ICD-10-CM | POA: Diagnosis not present

## 2021-03-14 ENCOUNTER — Other Ambulatory Visit (HOSPITAL_COMMUNITY): Payer: Self-pay

## 2021-04-05 ENCOUNTER — Other Ambulatory Visit: Payer: Self-pay | Admitting: Internal Medicine

## 2021-04-05 ENCOUNTER — Other Ambulatory Visit (HOSPITAL_COMMUNITY): Payer: Self-pay

## 2021-04-05 MED ORDER — INSULIN LISPRO PROT & LISPRO (75-25 MIX) 100 UNIT/ML KWIKPEN
24.0000 [IU] | PEN_INJECTOR | Freq: Two times a day (BID) | SUBCUTANEOUS | 2 refills | Status: DC
  Filled 2021-04-05: qty 15, 31d supply, fill #0
  Filled 2021-05-11: qty 15, 31d supply, fill #1
  Filled 2021-07-03: qty 15, 31d supply, fill #2

## 2021-04-06 ENCOUNTER — Other Ambulatory Visit (HOSPITAL_COMMUNITY): Payer: Self-pay

## 2021-05-01 ENCOUNTER — Other Ambulatory Visit (HOSPITAL_BASED_OUTPATIENT_CLINIC_OR_DEPARTMENT_OTHER): Payer: Self-pay

## 2021-05-04 ENCOUNTER — Encounter: Payer: Self-pay | Admitting: Family

## 2021-05-05 ENCOUNTER — Ambulatory Visit (INDEPENDENT_AMBULATORY_CARE_PROVIDER_SITE_OTHER): Payer: 59 | Admitting: Podiatry

## 2021-05-05 ENCOUNTER — Encounter: Payer: Self-pay | Admitting: Podiatry

## 2021-05-05 ENCOUNTER — Ambulatory Visit (INDEPENDENT_AMBULATORY_CARE_PROVIDER_SITE_OTHER): Payer: 59

## 2021-05-05 ENCOUNTER — Other Ambulatory Visit: Payer: Self-pay

## 2021-05-05 DIAGNOSIS — M25572 Pain in left ankle and joints of left foot: Secondary | ICD-10-CM | POA: Diagnosis not present

## 2021-05-05 DIAGNOSIS — Q666 Other congenital valgus deformities of feet: Secondary | ICD-10-CM

## 2021-05-05 DIAGNOSIS — M76822 Posterior tibial tendinitis, left leg: Secondary | ICD-10-CM | POA: Diagnosis not present

## 2021-05-08 ENCOUNTER — Telehealth: Payer: 59 | Admitting: Physician Assistant

## 2021-05-08 ENCOUNTER — Other Ambulatory Visit (HOSPITAL_COMMUNITY): Payer: Self-pay

## 2021-05-08 DIAGNOSIS — R3989 Other symptoms and signs involving the genitourinary system: Secondary | ICD-10-CM

## 2021-05-08 MED ORDER — NITROFURANTOIN MONOHYD MACRO 100 MG PO CAPS
100.0000 mg | ORAL_CAPSULE | Freq: Two times a day (BID) | ORAL | 0 refills | Status: DC
  Filled 2021-05-08: qty 14, 7d supply, fill #0

## 2021-05-08 NOTE — Progress Notes (Signed)
Virtual Visit Consent   Jeanette Meyer, you are scheduled for a virtual visit with a [facility] provider today.     Just as with appointments in the office, your consent must be obtained to participate.  Your consent will be active for this visit and any virtual visit you may have with one of our providers in the next 365 days.     If you have a MyChart account, a copy of this consent can be sent to you electronically.  All virtual visits are billed to your insurance company just like a traditional visit in the office.    As this is a virtual visit, video technology does not allow for your provider to perform a traditional examination.  This may limit your provider's ability to fully assess your condition.  If your provider identifies any concerns that need to be evaluated in person or the need to arrange testing (such as labs, EKG, etc.), we will make arrangements to do so.     Although advances in technology are sophisticated, we cannot ensure that it will always work on either your end or our end.  If the connection with a video visit is poor, the visit may have to be switched to a telephone visit.  With either a video or telephone visit, we are not always able to ensure that we have a secure connection.     I need to obtain your verbal consent now.   Are you willing to proceed with your visit today?    Jeanette Meyer has provided verbal consent on 05/08/2021 for a virtual visit (video or telephone).   Mar Daring, PA-C   Date: 05/08/2021 7:43 AM   Virtual Visit via Video Note   I, Mar Daring, connected with  Jeanette Meyer  ([id number], [birth year]) on 05/08/21 at  7:45 AM EST by a video-enabled telemedicine application and verified that I am speaking with the correct person using two identifiers.  Location: Patient: Virtual Visit Location Patient: Home Provider: Virtual Visit Location Provider: Home Office   I discussed the limitations of evaluation and  management by telemedicine and the availability of in person appointments. The patient expressed understanding and agreed to proceed.    History of Present Illness: Jeanette Meyer is a 44 y.o. who identifies as a female who was assigned female at birth, and is being seen today for suspected UTI.  HPI: Urinary Tract Infection  This is a new problem. The current episode started in the past 7 days (started Wednesday and Thursday last week). The problem has been gradually worsening. The quality of the pain is described as aching and stabbing. The pain is moderate. There has been no fever. Associated symptoms include chills, frequency, hesitancy, nausea and urgency. Pertinent negatives include no flank pain, hematuria (unknonw as she just finished menstrual) or vomiting. Associated symptoms comments: Abdominal pain started around 5 am this morning. She has tried increased fluids ([place]) for the symptoms. The treatment provided no relief. There is no history of recurrent UTIs, urinary stasis or a urological procedure.     Problems:  Patient Active Problem List   Diagnosis Date Noted   Dyslipidemia 01/27/2019   At risk for cardiovascular event 10/28/2018   Abscess of pubic region 04/29/2018   Essential hypertension 01/17/2018   Uncontrolled type 2 diabetes mellitus 01/17/2018   H/O rectal polypectomy 01/17/2018   Fibroid uterus 11/06/2017   Abnormal uterine bleeding (AUB) 11/06/2017   Abscess of groin, left 11/06/2017  Allergies:  Allergies  Allergen Reactions   Bactrim [Sulfamethoxazole-Trimethoprim] Diarrhea and Nausea And Vomiting   Clarithromycin Anaphylaxis   Doxycycline Anaphylaxis   Latex    Medications:  Current Outpatient Medications:    amLODipine (NORVASC) 5 MG tablet, TAKE 1 TABLET (5 MG TOTAL) BY MOUTH DAILY., Disp: 90 tablet, Rfl: 0   Blood Glucose Monitoring Suppl (BLOOD GLUCOSE MONITOR SYSTEM) w/Device KIT, Dispense Freestyle meter or per insurance coverage. Check  glucose once daily - ICD 10- E11.65, Disp: 1 each, Rfl: 0   cetirizine (ZYRTEC) 10 MG tablet, Take 10 mg by mouth daily., Disp: , Rfl:    Continuous Blood Gluc Receiver (FREESTYLE LIBRE 2 READER) DEVI, Use as directed to check blood sugar, Disp: 1 each, Rfl: 3   Continuous Blood Gluc Sensor (FREESTYLE LIBRE 2 SENSOR) MISC, Use as directed every 14 (fourteen) days., Disp: 2 each, Rfl: 6   dapagliflozin propanediol (FARXIGA) 10 MG TABS tablet, Take 1 tablet (10 mg total) by mouth daily before breakfast., Disp: 90 tablet, Rfl: 3   ELDERBERRY PO, Take by mouth daily. Uses pill and syrup, Disp: , Rfl:    glucose blood test strip, Check fasting blood sugar once daily., Disp: 150 each, Rfl: 12   Insulin Lispro Prot & Lispro (HUMALOG MIX 75/25 KWIKPEN) (75-25) 100 UNIT/ML Kwikpen, Inject 24 Units into the skin 2 (two) times daily with a meal., Disp: 15 mL, Rfl: 2   Insulin Pen Needle (PEN NEEDLES) 32G X 4 MM MISC, 1 Device by Does not apply route 2 (two) times daily with a meal., Disp: 200 each, Rfl: 3   Iron Polysacch Cmplx-B12-FA (POLY-IRON 150 FORTE) 150-0.025-1 MG CAPS, Take daily as directed, Disp: 100 capsule, Rfl: 0   lisinopril (ZESTRIL) 2.5 MG tablet, Take 1 tablet (2.5 mg total) by mouth daily., Disp: 90 tablet, Rfl: 0   metFORMIN (GLUCOPHAGE-XR) 750 MG 24 hr tablet, Take 1 tablet (750 mg total) by mouth daily with breakfast., Disp: 90 tablet, Rfl: 3   metoprolol tartrate (LOPRESSOR) 25 MG tablet, TAKE 1 TABLET BY MOUTH 2 TIMES DAILY., Disp: 180 tablet, Rfl: 1   Multiple Vitamin (MULTIVITAMIN) tablet, Take 1 tablet by mouth daily., Disp: , Rfl:    naproxen (NAPROSYN) 500 MG tablet, Take 1 tablet (500 mg total) by mouth 2 (two) times daily as needed., Disp: 60 tablet, Rfl: 1   neomycin-polymyxin b-dexamethasone (MAXITROL) 3.5-10000-0.1 OINT, Apply 1 inch strip to both eyes twice daily to affected lids for 1 week, then once daily for 2nd week, Disp: 3.5 g, Rfl: 0   Probiotic Product (PROBIOTIC PO),  Take by mouth daily., Disp: , Rfl:    rizatriptan (MAXALT) 10 MG tablet, Take 1 tablet (10 mg total) by mouth as needed for migraine. May repeat in 2 hours if needed, Disp: 10 tablet, Rfl: 1   Semaglutide, 1 MG/DOSE, (OZEMPIC, 1 MG/DOSE,) 4 MG/3ML SOPN, Inject 1 mg into the skin once a week., Disp: 3 mL, Rfl: 6   SODIUM FLUORIDE, DENTAL RINSE, 0.2 % SOLN, USE AS A ORAL RINSE AS DIRECTED ON BOTTLE., Disp: 473 mL, Rfl: 5  Observations/Objective: Patient is well-developed, well-nourished in no acute distress.  Resting comfortably at home.  Head is normocephalic, atraumatic.  No labored breathing.  Speech is clear and coherent with logical content.  Patient is alert and oriented at baseline.    Assessment and Plan: 1. Suspected UTI  - Worsening symptoms. - Will treat empirically with Macrobid - Continue to push fluids.  - She is to  call if symptoms do not improve or if they worsen.    Follow Up Instructions: I discussed the assessment and treatment plan with the patient. The patient was provided an opportunity to ask questions and all were answered. The patient agreed with the plan and demonstrated an understanding of the instructions.  A copy of instructions were sent to the patient via MyChart unless otherwise noted below.    The patient was advised to call back or seek an in-person evaluation if the symptoms worsen or if the condition fails to improve as anticipated.  Time:  I spent 12 minutes with the patient via telehealth technology discussing the above problems/concerns.    Mar Daring, PA-C

## 2021-05-08 NOTE — Patient Instructions (Signed)
Jeanette Meyer, thank you for joining Mar Daring, PA-C for today's virtual visit.  While this provider is not your primary care provider (PCP), if your PCP is located in our provider database this encounter information will be shared with them immediately following your visit.  Consent: (Patient) Jeanette Meyer provided verbal consent for this virtual visit at the beginning of the encounter.  Current Medications:  Current Outpatient Medications:    nitrofurantoin, macrocrystal-monohydrate, (MACROBID) 100 MG capsule, Take 1 capsule (100 mg total) by mouth 2 (two) times daily., Disp: 14 capsule, Rfl: 0   amLODipine (NORVASC) 5 MG tablet, TAKE 1 TABLET (5 MG TOTAL) BY MOUTH DAILY., Disp: 90 tablet, Rfl: 0   Blood Glucose Monitoring Suppl (BLOOD GLUCOSE MONITOR SYSTEM) w/Device KIT, Dispense Freestyle meter or per insurance coverage. Check glucose once daily - ICD 10- E11.65, Disp: 1 each, Rfl: 0   cetirizine (ZYRTEC) 10 MG tablet, Take 10 mg by mouth daily., Disp: , Rfl:    Continuous Blood Gluc Receiver (FREESTYLE LIBRE 2 READER) DEVI, Use as directed to check blood sugar, Disp: 1 each, Rfl: 3   Continuous Blood Gluc Sensor (FREESTYLE LIBRE 2 SENSOR) MISC, Use as directed every 14 (fourteen) days., Disp: 2 each, Rfl: 6   dapagliflozin propanediol (FARXIGA) 10 MG TABS tablet, Take 1 tablet (10 mg total) by mouth daily before breakfast., Disp: 90 tablet, Rfl: 3   ELDERBERRY PO, Take by mouth daily. Uses pill and syrup, Disp: , Rfl:    glucose blood test strip, Check fasting blood sugar once daily., Disp: 150 each, Rfl: 12   Insulin Lispro Prot & Lispro (HUMALOG MIX 75/25 KWIKPEN) (75-25) 100 UNIT/ML Kwikpen, Inject 24 Units into the skin 2 (two) times daily with a meal., Disp: 15 mL, Rfl: 2   Insulin Pen Needle (PEN NEEDLES) 32G X 4 MM MISC, 1 Device by Does not apply route 2 (two) times daily with a meal., Disp: 200 each, Rfl: 3   Iron Polysacch Cmplx-B12-FA (POLY-IRON 150 FORTE)  150-0.025-1 MG CAPS, Take daily as directed, Disp: 100 capsule, Rfl: 0   lisinopril (ZESTRIL) 2.5 MG tablet, Take 1 tablet (2.5 mg total) by mouth daily., Disp: 90 tablet, Rfl: 0   metFORMIN (GLUCOPHAGE-XR) 750 MG 24 hr tablet, Take 1 tablet (750 mg total) by mouth daily with breakfast., Disp: 90 tablet, Rfl: 3   metoprolol tartrate (LOPRESSOR) 25 MG tablet, TAKE 1 TABLET BY MOUTH 2 TIMES DAILY., Disp: 180 tablet, Rfl: 1   Multiple Vitamin (MULTIVITAMIN) tablet, Take 1 tablet by mouth daily., Disp: , Rfl:    naproxen (NAPROSYN) 500 MG tablet, Take 1 tablet (500 mg total) by mouth 2 (two) times daily as needed., Disp: 60 tablet, Rfl: 1   neomycin-polymyxin b-dexamethasone (MAXITROL) 3.5-10000-0.1 OINT, Apply 1 inch strip to both eyes twice daily to affected lids for 1 week, then once daily for 2nd week, Disp: 3.5 g, Rfl: 0   Probiotic Product (PROBIOTIC PO), Take by mouth daily., Disp: , Rfl:    rizatriptan (MAXALT) 10 MG tablet, Take 1 tablet (10 mg total) by mouth as needed for migraine. May repeat in 2 hours if needed, Disp: 10 tablet, Rfl: 1   Semaglutide, 1 MG/DOSE, (OZEMPIC, 1 MG/DOSE,) 4 MG/3ML SOPN, Inject 1 mg into the skin once a week., Disp: 3 mL, Rfl: 6   SODIUM FLUORIDE, DENTAL RINSE, 0.2 % SOLN, USE AS A ORAL RINSE AS DIRECTED ON BOTTLE., Disp: 473 mL, Rfl: 5   Medications ordered in this  encounter:  Meds ordered this encounter  Medications   nitrofurantoin, macrocrystal-monohydrate, (MACROBID) 100 MG capsule    Sig: Take 1 capsule (100 mg total) by mouth 2 (two) times daily.    Dispense:  14 capsule    Refill:  0    Order Specific Question:   Supervising Provider    Answer:   Sabra Heck, [place]     *If you need refills on other medications prior to your next appointment, please contact your pharmacy*  Follow-Up: Call back or seek an in-person evaluation if the symptoms worsen or if the condition fails to improve as anticipated.  Other Instructions Urinary Tract Infection,  Adult A urinary tract infection (UTI) is an infection of any part of the urinary tract. The urinary tract includes the kidneys, ureters, bladder, and urethra. These organs make, store, and get rid of urine in the body. An upper UTI affects the ureters and kidneys. A lower UTI affects the bladder and urethra. What are the causes? Most urinary tract infections are caused by bacteria in your genital area around your urethra, where urine leaves your body. These bacteria grow and cause inflammation of your urinary tract. What increases the risk? You are more likely to develop this condition if: You have a urinary catheter that stays in place. You are not able to control when you urinate or have a bowel movement (incontinence). You are female and you: Use a spermicide or diaphragm for birth control. Have low estrogen levels. Are pregnant. You have certain genes that increase your risk. You are sexually active. You take antibiotic medicines. You have a condition that causes your flow of urine to slow down, such as: An enlarged prostate, if you are female. Blockage in your urethra. A kidney stone. A nerve condition that affects your bladder control (neurogenic bladder). Not getting enough to drink, or not urinating often. You have certain medical conditions, such as: Diabetes. A weak disease-fighting system (immunesystem). Sickle cell disease. Gout. Spinal cord injury. What are the signs or symptoms? Symptoms of this condition include: Needing to urinate right away (urgency). Frequent urination. This may include small amounts of urine each time you urinate. Pain or burning with urination. Blood in the urine. Urine that smells bad or unusual. Trouble urinating. Cloudy urine. Vaginal discharge, if you are female. Pain in the abdomen or the lower back. You may also have: Vomiting or a decreased appetite. Confusion. Irritability or tiredness. A fever or chills. Diarrhea. The first  symptom in older adults may be confusion. In some cases, they may not have any symptoms until the infection has worsened. How is this diagnosed? This condition is diagnosed based on your medical history and a physical exam. You may also have other tests, including: Urine tests. Blood tests. Tests for STIs (sexually transmitted infections). If you have had more than one UTI, a cystoscopy or imaging studies may be done to determine the cause of the infections. How is this treated? Treatment for this condition includes: Antibiotic medicine. Over-the-counter medicines to treat discomfort. Drinking enough water to stay hydrated. If you have frequent infections or have other conditions such as a kidney stone, you may need to see a health care provider who specializes in the urinary tract (urologist). In rare cases, urinary tract infections can cause sepsis. Sepsis is a life-threatening condition that occurs when the body responds to an infection. Sepsis is treated in the hospital with IV antibiotics, fluids, and other medicines. Follow these instructions at home: Medicines Take over-the-counter and  prescription medicines only as told by your health care provider. If you were prescribed an antibiotic medicine, take it as told by your health care provider. Do not stop using the antibiotic even if you start to feel better. General instructions Make sure you: Empty your bladder often and completely. Do not hold urine for long periods of time. Empty your bladder after sex. Wipe from front to back after urinating or having a bowel movement if you are female. Use each tissue only one time when you wipe. Drink enough fluid to keep your urine pale yellow. Keep all follow-up visits. This is important. Contact a health care provider if: Your symptoms do not get better after 1-2 days. Your symptoms go away and then return. Get help right away if: You have severe pain in your back or your lower  abdomen. You have a fever or chills. You have nausea or vomiting. Summary A urinary tract infection (UTI) is an infection of any part of the urinary tract, which includes the kidneys, ureters, bladder, and urethra. Most urinary tract infections are caused by bacteria in your genital area. Treatment for this condition often includes antibiotic medicines. If you were prescribed an antibiotic medicine, take it as told by your health care provider. Do not stop using the antibiotic even if you start to feel better. Keep all follow-up visits. This is important. This information is not intended to replace advice given to you by your health care provider. Make sure you discuss any questions you have with your health care provider. Document Revised: 12/18/2019 Document Reviewed: 12/18/2019 Elsevier Patient Education  2022 [employer].    If you have been instructed to have an in-person evaluation today at a local Urgent Care facility, please use the link below. It will take you to a list of all of our available [facility], including address, phone number and hours of operation. Please do not delay care.  [facility]  If you or a family member do not have a primary care provider, use the link below to schedule a visit and establish care. When you choose a [facility] primary care physician or advanced practice provider, you gain a long-term partner in health. Find a Primary Care Provider  Learn more about [facility] in-office and virtual care options: [facility]

## 2021-05-09 ENCOUNTER — Other Ambulatory Visit (HOSPITAL_COMMUNITY): Payer: Self-pay

## 2021-05-09 ENCOUNTER — Ambulatory Visit: Payer: 59 | Admitting: Family

## 2021-05-09 ENCOUNTER — Encounter: Payer: Self-pay | Admitting: Physician Assistant

## 2021-05-09 MED ORDER — FLUCONAZOLE 150 MG PO TABS
150.0000 mg | ORAL_TABLET | Freq: Every day | ORAL | 0 refills | Status: DC
  Filled 2021-05-09: qty 1, 1d supply, fill #0

## 2021-05-10 ENCOUNTER — Encounter: Payer: Self-pay | Admitting: Podiatry

## 2021-05-10 NOTE — Progress Notes (Signed)
Subjective:  Patient ID: Jeanette Meyer, female    DOB: 1975/11/06,  MRN: [id number]  Chief Complaint  Patient presents with   Foot Pain    Left ankle pain for about 3 weeks pain comes and goes     45 y.o. female presents with the above complaint.  Patient presents with complaint of left medial ankle/foot pain.  Is been going for 3 weeks has progressed to gotten worse.  Hurts with ambulation.  She wanted get it evaluated.  She has not had this kind of pain before in the past.  Pain scale 7 out of 10.  Sharp shooting with sometimes dull aching nature to it.  She does not wear orthotics or wears regular shoes.  She denies any significant issues.  She is tried some over-the-counter medications none of which has helped.  She is a diabetic.   Review of Systems: Negative except as noted in the HPI. Denies N/V/F/Ch.  Past Medical History:  Diagnosis Date   Diabetes mellitus without complication (HCC)    Fibroid    GERD (gastroesophageal reflux disease)    Hypertension     Current Outpatient Medications:    amLODipine (NORVASC) 5 MG tablet, TAKE 1 TABLET (5 MG TOTAL) BY MOUTH DAILY., Disp: 90 tablet, Rfl: 0   Blood Glucose Monitoring Suppl (BLOOD GLUCOSE MONITOR SYSTEM) w/Device KIT, Dispense Freestyle meter or per insurance coverage. Check glucose once daily - ICD 10- E11.65, Disp: 1 each, Rfl: 0   cetirizine (ZYRTEC) 10 MG tablet, Take 10 mg by mouth daily., Disp: , Rfl:    Continuous Blood Gluc Receiver (FREESTYLE LIBRE 2 READER) DEVI, Use as directed to check blood sugar, Disp: 1 each, Rfl: 3   Continuous Blood Gluc Sensor (FREESTYLE LIBRE 2 SENSOR) MISC, Use as directed every 14 (fourteen) days., Disp: 2 each, Rfl: 6   dapagliflozin propanediol (FARXIGA) 10 MG TABS tablet, Take 1 tablet (10 mg total) by mouth daily before breakfast., Disp: 90 tablet, Rfl: 3   ELDERBERRY PO, Take by mouth daily. Uses pill and syrup, Disp: , Rfl:    fluconazole (DIFLUCAN) 150 MG tablet, Take 1 tablet (150  mg total) by mouth daily., Disp: 1 tablet, Rfl: 0   glucose blood test strip, Check fasting blood sugar once daily., Disp: 150 each, Rfl: 12   Insulin Lispro Prot & Lispro (HUMALOG MIX 75/25 KWIKPEN) (75-25) 100 UNIT/ML Kwikpen, Inject 24 Units into the skin 2 (two) times daily with a meal., Disp: 15 mL, Rfl: 2   Insulin Pen Needle (PEN NEEDLES) 32G X 4 MM MISC, 1 Device by Does not apply route 2 (two) times daily with a meal., Disp: 200 each, Rfl: 3   Iron Polysacch Cmplx-B12-FA (POLY-IRON 150 FORTE) 150-0.025-1 MG CAPS, Take daily as directed, Disp: 100 capsule, Rfl: 0   lisinopril (ZESTRIL) 2.5 MG tablet, Take 1 tablet (2.5 mg total) by mouth daily., Disp: 90 tablet, Rfl: 0   metFORMIN (GLUCOPHAGE-XR) 750 MG 24 hr tablet, Take 1 tablet (750 mg total) by mouth daily with breakfast., Disp: 90 tablet, Rfl: 3   metoprolol tartrate (LOPRESSOR) 25 MG tablet, TAKE 1 TABLET BY MOUTH 2 TIMES DAILY., Disp: 180 tablet, Rfl: 1   Multiple Vitamin (MULTIVITAMIN) tablet, Take 1 tablet by mouth daily., Disp: , Rfl:    naproxen (NAPROSYN) 500 MG tablet, Take 1 tablet (500 mg total) by mouth 2 (two) times daily as needed., Disp: 60 tablet, Rfl: 1   neomycin-polymyxin b-dexamethasone (MAXITROL) 3.5-10000-0.1 OINT, Apply 1 inch strip to  both eyes twice daily to affected lids for 1 week, then once daily for 2nd week, Disp: 3.5 g, Rfl: 0   nitrofurantoin, macrocrystal-monohydrate, (MACROBID) 100 MG capsule, Take 1 capsule (100 mg total) by mouth 2 (two) times daily., Disp: 14 capsule, Rfl: 0   Probiotic Product (PROBIOTIC PO), Take by mouth daily., Disp: , Rfl:    rizatriptan (MAXALT) 10 MG tablet, Take 1 tablet (10 mg total) by mouth as needed for migraine. May repeat in 2 hours if needed, Disp: 10 tablet, Rfl: 1   Semaglutide, 1 MG/DOSE, (OZEMPIC, 1 MG/DOSE,) 4 MG/3ML SOPN, Inject 1 mg into the skin once a week., Disp: 3 mL, Rfl: 6   SODIUM FLUORIDE, DENTAL RINSE, 0.2 % SOLN, USE AS A ORAL RINSE AS DIRECTED ON  BOTTLE., Disp: 473 mL, Rfl: 5  Social History   Tobacco Use  Smoking Status Never  Smokeless Tobacco Never    Allergies  Allergen Reactions   Bactrim [Sulfamethoxazole-Trimethoprim] Diarrhea and Nausea And Vomiting   Clarithromycin Anaphylaxis   Doxycycline Anaphylaxis   Latex    Objective:  There were no vitals filed for this visit. There is no height or weight on file to calculate BMI. Constitutional Well developed. Well nourished.  Vascular Dorsalis pedis pulses palpable bilaterally. Posterior tibial pulses palpable bilaterally. Capillary refill normal to all digits.  No cyanosis or clubbing noted. Pedal hair growth normal.  Neurologic Normal speech. Oriented to person, place, and time. Epicritic sensation to light touch grossly present bilaterally.  Dermatologic Nails well groomed and normal in appearance. No open wounds. No skin lesions.  Orthopedic: Pain on palpation along the course of the posterior tibial tendon including the insertion into the navicular.  Pain with resisted plantarflexion inversion of the foot.  No pain with dorsiflexion eversion of the foot.  No pain at the Achilles tendon, peroneal tendon, ATFL ligament.  Gait examination shows pes planovalgus foot structure with calcaneovalgus to many toe signs partially able to recruit the arch with dorsiflexion of the hallux.   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  Exostosis of the navicular noted.  No arthritic changes noted.  Pes planovalgus foot structure noted. Assessment:   1. Posterior tibial tendinitis, left   2. Pes planovalgus    Plan:  Patient was evaluated and treated and all questions answered.  Left posterior tibial tendinitis -I explained the patient the etiology of tendinitis or instrument options were discussed.  Given the amount of pain that she is having she would benefit from cam boot immobilization.  Cam boot was  dispensed.  If she does not have any improvement I will discuss steroid injection versus MRI evaluation.  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  Pes planovalgus -I explained to patient the etiology of pes planovalgus and relationship with posterior tibial tendinitis left I and various treatment options were discussed.  Given patient foot structure in the setting of posterior tibial tendinitis left I believe patient will benefit from custom-made orthotics to help control the hindfoot motion support the arch of the foot and take the stress away from posterior tibial tendinitis.  Patient agrees with the plan like to proceed with orthotics -Patient was casted for orthotics   No follow-ups on file.

## 2021-05-11 ENCOUNTER — Encounter: Payer: Self-pay | Admitting: Podiatry

## 2021-05-11 ENCOUNTER — Other Ambulatory Visit (HOSPITAL_COMMUNITY): Payer: Self-pay

## 2021-05-24 ENCOUNTER — Encounter: Payer: Self-pay | Admitting: Podiatry

## 2021-06-01 ENCOUNTER — Telehealth: Payer: Self-pay | Admitting: Podiatry

## 2021-06-01 NOTE — Telephone Encounter (Signed)
Orthotics in... lvm for pt to call to schedule an appt to pick up the orthotics.

## 2021-06-02 ENCOUNTER — Telehealth: Payer: Self-pay | Admitting: Podiatry

## 2021-06-02 NOTE — Telephone Encounter (Signed)
Pt left message returning my call to get an appt to pick up her orthotics.  I returned call and left message for her to call me back that I have a limited number of appts next Thursday or Friday but have availability the following week.

## 2021-06-07 ENCOUNTER — Encounter: Payer: Self-pay | Admitting: Family

## 2021-06-08 ENCOUNTER — Ambulatory Visit (INDEPENDENT_AMBULATORY_CARE_PROVIDER_SITE_OTHER): Payer: 59 | Admitting: Podiatrist

## 2021-06-08 ENCOUNTER — Ambulatory Visit: Payer: 59

## 2021-06-08 ENCOUNTER — Other Ambulatory Visit: Payer: Self-pay

## 2021-06-08 DIAGNOSIS — Q666 Other congenital valgus deformities of feet: Secondary | ICD-10-CM

## 2021-06-08 DIAGNOSIS — M76822 Posterior tibial tendinitis, left leg: Secondary | ICD-10-CM

## 2021-06-08 DIAGNOSIS — M722 Plantar fascial fibromatosis: Secondary | ICD-10-CM

## 2021-06-08 NOTE — Progress Notes (Signed)
Chief Complaint  Patient presents with   Foot Pain    Left ankle pain. Pt states the cam boot hasnt helped.     HPI: Patient is 46 y.o. female who presents today for pain in the left heel.  She states the pain is more towards the medial side to bottom of the left heel and is not traveling up the leg as it was in November.  She relates she got a cam boot which she has been wearing and states it hasn't really helped. .  She also presented today to be casted for some orthotics with Aaron Edelman prior to this appontment.   Allergies  Allergen Reactions   Bactrim [Sulfamethoxazole-Trimethoprim] Diarrhea and Nausea And Vomiting   Clarithromycin Anaphylaxis   Doxycycline Anaphylaxis   Latex     Review of systems is negative except as noted in the HPI.  Denies nausea/ vomiting/ fevers/ chills or night sweats.   Denies difficulty breathing, denies calf pain or tenderness  Physical Exam  Patient is awake, alert, and oriented x 3.  In no acute distress.    Vascular status is intact with palpable pedal pulses DP and PT bilateral and capillary refill time less than 3 seconds bilateral.  No edema or erythema noted.   Neurological exam reveals epicritic and protective sensation grossly intact bilateral.   Dermatological exam reveals skin is supple and dry to bilateral feet.  No open lesions present.    Musculoskeletal exam: Pes planovalgus deformity is noted.  Pain along the posterior tibial tendon along its course and its insertion into the navicular is also noted.   pain along the plantar fascial area along the medial calcaneal tubercle at its insertion is also seen at today's visit as well.   Assessment:   ICD-10-CM   1. Posterior tibial tendinitis, left  M76.822     2. Plantar fasciitis of left foot  M72.2        Plan: I did agree to a steroid injection at today's visit the area of the plantar fasciitis/medial heel pain.  The skin was prepped with alcohol and an injection consisting of 10 mg  of Kenalog with Marcaine plain was infiltrated without complication.  The patient was instructed to stay in her cam walker for another 10 days.  Hopefully her orthotics will be ready for her soon which should help her condition.  Will call when the orthotics are ready to be picked up.  If any problems or concerns arise prior to that visit she is instructed to call.

## 2021-06-08 NOTE — Progress Notes (Signed)
SITUATION: Reason for Visit: Fitting and Delivery of Custom Fabricated Foot Orthoses Patient Report: Patient reports comfort and is satisfied with device.  OBJECTIVE DATA: Patient History / Diagnosis:     ICD-10-CM   1. 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 Patient tolerated fittign procedure.   Patient was provided with verbal and written instruction and demonstration regarding donning, doffing, wear, care, proper fit, function, purpose, cleaning, and use of the orthosis and in all related precautions and risks and benefits regarding the orthosis.  Patient was also provided with verbal instruction regarding how to report any failures or malfunctions of the orthosis and necessary follow up care. Patient was also instructed to contact our office regarding any change in status that may affect the function of the orthosis.  Patient demonstrated independence with proper donning, doffing, and fit and verbalized understanding of all instructions.  PLAN: Patient is to follow up in one week or as necessary (PRN). All questions were answered and concerns addressed. Plan of care was discussed with and agreed upon by the patient.

## 2021-06-08 NOTE — Patient Instructions (Signed)
Posterior Tibial Tendinitis Posterior tibial tendinitis is irritation of a tendon called the posterior tibial tendon. Your posterior tibial tendon is a cord-like tissue that connects bones of your lower leg and foot to a muscle that: Supports your arch. Helps you raise up on your toes. Helps you turn your foot down and in. This condition causes foot and ankle pain. It can also lead to a flat foot. What are the causes? This condition is most often caused by repeated stress to the tendon (overuse injury). It can also be caused by a sudden injury that stresses the tendon, such aslanding on your foot after jumping or falling. What increases the risk? This condition is more likely to develop in: People who play a sport that involves putting a lot of pressure on the feet, such as: Basketball. Tennis. Soccer. Hockey. Runners. Females who are older than 46 years of age and are overweight. People with diabetes. People with decreased foot stability. People with flat feet. What are the signs or symptoms? Symptoms include: Pain in the inner ankle. Pain at the arch of your foot. Pain that gets worse with running, walking, or standing. Swelling on the inside of your ankle and foot. Weakness in your ankle or foot. Inability to stand up on tiptoe. Flattening of the arch of your foot. How is this diagnosed? This condition may be diagnosed based on: Your symptoms. Your medical history. A physical exam. Tests, such as: X-ray. MRI. Ultrasound. How is this treated? This condition may be treated by: Putting ice to the injured area. Taking NSAIDs, such as ibuprofen, to reduce pain and swelling. Wearing a special shoe or shoe insert to support your arch (orthotic). Having physical therapy. Replacing high-impact exercise with low-impact exercise, such as swimming or cycling. If your symptoms do not improve with these treatments, you may need to wear a splint, removable walking boot, or short leg  cast for 6-8 weeks to keep your foot and ankle still (immobilized). Follow these instructions at home: If you have a cast, splint, or boot: Keep it clean and dry. Check the skin around it every day. Tell your health care provider about any concerns. If you have a cast: Do not stick anything inside it to scratch your skin. Doing that increases your risk of infection. You may put lotion on dry skin around the edges of the cast. Do not put lotion on the skin underneath the cast. If you have a splint or boot: Wear it as told by your health care provider. Remove it only as told by your health care provider. Loosen it if your toes tingle, become numb, or turn cold and blue. Bathing Do not take baths, swim, or use a hot tub until your health care provider approves. Ask your health care provider if you may take showers. If your cast, splint, or boot is not waterproof: Do not let it get wet. Cover it with a waterproof covering while you take a bath or a shower. Managing pain and swelling  If directed, put ice on the injured area. If you have a removable splint or boot, remove it as told by your health care provider. Put ice in a plastic bag. Place a towel between your skin and the bag or between your cast and the bag. Leave the ice on for 20 minutes, 2-3 times a day. Move your toes often to reduce stiffness and swelling. Raise (elevate) the injured area above the level of your heart while you are sitting or lying down.    Activity Do not use the injured foot to support your body weight until your health care provider says that you can. Use crutches as told by your health care provider. Do not do activities that make pain or swelling worse. Ask your health care provider when it is safe to drive if you have a cast, splint, or boot on your foot. Return to your normal activities as told by your health care provider. Ask your health care provider what activities are safe for you. Do exercises as told  by your health care provider. General instructions Take over-the-counter and prescription medicines only as told by your health care provider. If you have an orthotic, use it as told by your health care provider. Keep all follow-up visits as told by your health care provider. This is important. How is this prevented? Wear footwear that is appropriate to your athletic activity. Avoid athletic activities that cause pain or swelling in your ankle or foot. Before being active, do range-of-motion and stretching exercises. If you develop pain or swelling while training, stop training. If you have pain or swelling that does not improve after a few days of rest, see your health care provider. If you start a new athletic activity, start gradually so you can build up your strength and flexibility. Contact a health care provider if: Your symptoms get worse. Your symptoms do not improve in 6-8 weeks. You develop new, unexplained symptoms. Your splint, boot, or cast gets damaged. Summary Posterior tibial tendinitis is irritation of a tendon called the posterior tibial tendon. This condition is most often caused by repeated stress to the tendon (overuse injury). This condition causes foot pain and ankle pain. It can also lead to a flat foot. This condition may be treated by not doing high-impact activities, applying ice, having physical therapy, wearing orthotics, and wearing a cast, splint, or boot if needed. This information is not intended to replace advice given to you by your health care provider. Make sure you discuss any questions you have with your healthcare provider. Document Revised: 09/02/2018 Document Reviewed: 07/10/2018 Elsevier Patient Education  2022 [employer] Inc.  

## 2021-06-15 ENCOUNTER — Encounter: Payer: Self-pay | Admitting: Podiatrist

## 2021-06-15 MED ORDER — TRIAMCINOLONE ACETONIDE 10 MG/ML IJ SUSP
10.0000 mg | Freq: Once | INTRAMUSCULAR | Status: AC
Start: 2021-06-15 — End: 2021-06-08
  Administered 2021-06-08: 10 mg

## 2021-06-19 ENCOUNTER — Other Ambulatory Visit (HOSPITAL_COMMUNITY): Payer: Self-pay

## 2021-07-03 ENCOUNTER — Other Ambulatory Visit (HOSPITAL_COMMUNITY): Payer: Self-pay

## 2021-07-04 ENCOUNTER — Encounter: Payer: Self-pay | Admitting: Podiatry

## 2021-07-05 ENCOUNTER — Other Ambulatory Visit: Payer: Self-pay

## 2021-07-05 ENCOUNTER — Ambulatory Visit (INDEPENDENT_AMBULATORY_CARE_PROVIDER_SITE_OTHER): Payer: 59 | Admitting: Podiatry

## 2021-07-05 DIAGNOSIS — M76822 Posterior tibial tendinitis, left leg: Secondary | ICD-10-CM | POA: Diagnosis not present

## 2021-07-05 NOTE — Progress Notes (Signed)
Subjective:  Patient ID: Jeanette Meyer, female    DOB: 05-15-1976,  MRN: [id number]  Chief Complaint  Patient presents with   Foot Pain    Pt stated that she still has some pain and swelling     46 y.o. female presents with the above complaint.  Patient presents follow-up from left posterior tibial tendinitis.  She states she is doing a lot better.  She states she still having some pain.  The Tri-Lock ankle brace has helped.  She would like to discuss next treatment plan.  Her pain scale is 3 out of 10   Review of Systems: Negative except as noted in the HPI. Denies N/V/F/Ch.  Past Medical History:  Diagnosis Date   Diabetes mellitus without complication (HCC)    Fibroid    GERD (gastroesophageal reflux disease)    Hypertension     Current Outpatient Medications:    amLODipine (NORVASC) 5 MG tablet, TAKE 1 TABLET (5 MG TOTAL) BY MOUTH DAILY., Disp: 90 tablet, Rfl: 0   Blood Glucose Monitoring Suppl (BLOOD GLUCOSE MONITOR SYSTEM) w/Device KIT, Dispense Freestyle meter or per insurance coverage. Check glucose once daily - ICD 10- E11.65, Disp: 1 each, Rfl: 0   cetirizine (ZYRTEC) 10 MG tablet, Take 10 mg by mouth daily., Disp: , Rfl:    Continuous Blood Gluc Receiver (FREESTYLE LIBRE 2 READER) DEVI, Use as directed to check blood sugar, Disp: 1 each, Rfl: 3   Continuous Blood Gluc Sensor (FREESTYLE LIBRE 2 SENSOR) MISC, Use as directed every 14 (fourteen) days., Disp: 2 each, Rfl: 6   dapagliflozin propanediol (FARXIGA) 10 MG TABS tablet, Take 1 tablet (10 mg total) by mouth daily before breakfast., Disp: 90 tablet, Rfl: 3   ELDERBERRY PO, Take by mouth daily. Uses pill and syrup, Disp: , Rfl:    fluconazole (DIFLUCAN) 150 MG tablet, Take 1 tablet (150 mg total) by mouth daily., Disp: 1 tablet, Rfl: 0   glucose blood test strip, Check fasting blood sugar once daily., Disp: 150 each, Rfl: 12   Insulin Lispro Prot & Lispro (HUMALOG MIX 75/25 KWIKPEN) (75-25) 100 UNIT/ML Kwikpen, Inject  24 Units into the skin 2 (two) times daily with a meal., Disp: 15 mL, Rfl: 2   Insulin Pen Needle (PEN NEEDLES) 32G X 4 MM MISC, 1 Device by Does not apply route 2 (two) times daily with a meal., Disp: 200 each, Rfl: 3   Iron Polysacch Cmplx-B12-FA (POLY-IRON 150 FORTE) 150-0.025-1 MG CAPS, Take daily as directed, Disp: 100 capsule, Rfl: 0   lisinopril (ZESTRIL) 2.5 MG tablet, Take 1 tablet (2.5 mg total) by mouth daily., Disp: 90 tablet, Rfl: 0   metFORMIN (GLUCOPHAGE-XR) 750 MG 24 hr tablet, Take 1 tablet (750 mg total) by mouth daily with breakfast., Disp: 90 tablet, Rfl: 3   metoprolol tartrate (LOPRESSOR) 25 MG tablet, TAKE 1 TABLET BY MOUTH 2 TIMES DAILY., Disp: 180 tablet, Rfl: 1   Multiple Vitamin (MULTIVITAMIN) tablet, Take 1 tablet by mouth daily., Disp: , Rfl:    naproxen (NAPROSYN) 500 MG tablet, Take 1 tablet (500 mg total) by mouth 2 (two) times daily as needed., Disp: 60 tablet, Rfl: 1   neomycin-polymyxin b-dexamethasone (MAXITROL) 3.5-10000-0.1 OINT, Apply 1 inch strip to both eyes twice daily to affected lids for 1 week, then once daily for 2nd week, Disp: 3.5 g, Rfl: 0   nitrofurantoin, macrocrystal-monohydrate, (MACROBID) 100 MG capsule, Take 1 capsule (100 mg total) by mouth 2 (two) times daily., Disp: 14 capsule, Rfl:  0   Probiotic Product (PROBIOTIC PO), Take by mouth daily., Disp: , Rfl:    rizatriptan (MAXALT) 10 MG tablet, Take 1 tablet (10 mg total) by mouth as needed for migraine. May repeat in 2 hours if needed, Disp: 10 tablet, Rfl: 1   Semaglutide, 1 MG/DOSE, (OZEMPIC, 1 MG/DOSE,) 4 MG/3ML SOPN, Inject 1 mg into the skin once a week., Disp: 3 mL, Rfl: 6   SODIUM FLUORIDE, DENTAL RINSE, 0.2 % SOLN, USE AS A ORAL RINSE AS DIRECTED ON BOTTLE., Disp: 473 mL, Rfl: 5  Social History   Tobacco Use  Smoking Status Never  Smokeless Tobacco Never    Allergies  Allergen Reactions   Bactrim [Sulfamethoxazole-Trimethoprim] Diarrhea and Nausea And Vomiting   Clarithromycin  Anaphylaxis   Doxycycline Anaphylaxis   Latex    Objective:  There were no vitals filed for this visit. There is no height or weight on file to calculate BMI. Constitutional Well developed. Well nourished.  Vascular Dorsalis pedis pulses palpable bilaterally. Posterior tibial pulses palpable bilaterally. Capillary refill normal to all digits.  No cyanosis or clubbing noted. Pedal hair growth normal.  Neurologic Normal speech. Oriented to person, place, and time. Epicritic sensation to light touch grossly present bilaterally.  Dermatologic Nails well groomed and normal in appearance. No open wounds. No skin lesions.  Orthopedic: Pain on palpation along the course of the posterior tibial tendon including the insertion into the navicular.  Pain with resisted plantarflexion inversion of the foot.  No pain with dorsiflexion eversion of the foot.  No pain at the Achilles tendon, peroneal tendon, ATFL ligament.  Gait examination shows pes planovalgus foot structure with calcaneovalgus to many toe signs partially able to recruit the arch with dorsiflexion of the hallux.   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  Exostosis of the navicular noted.  No arthritic changes noted.  Pes planovalgus foot structure noted. Assessment:   1. Posterior tibial tendinitis, left     Plan:  Patient was evaluated and treated and all questions answered.  Left posterior tibial tendinitis -I explained the patient the etiology of tendinitis or instrument options were discussed.   -Continue using Tri-Lock ankle brace given that it is clinically improving and will help with the transition. -If there is no improvement we will need to discuss doing an MRI during next clinical visit.  Pes planovalgus -I explained to patient the etiology of pes planovalgus and relationship with posterior tibial tendinitis left I and various treatment options were  discussed.  Given patient foot structure in the setting of posterior tibial tendinitis left I believe patient will benefit from custom-made orthotics to help control the hindfoot motion support the arch of the foot and take the stress away from posterior tibial tendinitis.  Patient agrees with the plan like to proceed with orthotics -Orthotics are functioning well   No follow-ups on file.

## 2021-07-06 ENCOUNTER — Encounter: Payer: Self-pay | Admitting: Podiatry

## 2021-07-21 ENCOUNTER — Other Ambulatory Visit (HOSPITAL_COMMUNITY): Payer: Self-pay

## 2021-07-27 ENCOUNTER — Encounter: Payer: Self-pay | Admitting: Podiatry

## 2021-07-28 NOTE — Telephone Encounter (Signed)
Jeanette Meyer- can you please assist her? Thanks! ?

## 2021-07-31 ENCOUNTER — Other Ambulatory Visit (HOSPITAL_BASED_OUTPATIENT_CLINIC_OR_DEPARTMENT_OTHER): Payer: Self-pay

## 2021-08-02 ENCOUNTER — Other Ambulatory Visit (HOSPITAL_BASED_OUTPATIENT_CLINIC_OR_DEPARTMENT_OTHER): Payer: Self-pay

## 2021-08-08 ENCOUNTER — Other Ambulatory Visit (HOSPITAL_BASED_OUTPATIENT_CLINIC_OR_DEPARTMENT_OTHER): Payer: Self-pay

## 2021-08-08 ENCOUNTER — Ambulatory Visit: Payer: 59 | Admitting: Family

## 2021-08-08 VITALS — BP 130/78 | HR 95 | Temp 98.0°F | Resp 18 | Ht 64.0 in | Wt 209.2 lb

## 2021-08-08 DIAGNOSIS — I10 Essential (primary) hypertension: Secondary | ICD-10-CM

## 2021-08-08 DIAGNOSIS — J309 Allergic rhinitis, unspecified: Secondary | ICD-10-CM | POA: Diagnosis not present

## 2021-08-08 DIAGNOSIS — E1165 Type 2 diabetes mellitus with hyperglycemia: Secondary | ICD-10-CM

## 2021-08-08 DIAGNOSIS — E785 Hyperlipidemia, unspecified: Secondary | ICD-10-CM | POA: Diagnosis not present

## 2021-08-08 MED ORDER — LISINOPRIL 5 MG PO TABS
5.0000 mg | ORAL_TABLET | Freq: Every day | ORAL | 1 refills | Status: AC
  Filled 2021-08-08: qty 90, 90d supply, fill #0
  Filled 2021-11-06: qty 90, 90d supply, fill #1

## 2021-08-08 MED ORDER — AMLODIPINE BESYLATE 5 MG PO TABS
ORAL_TABLET | Freq: Every day | ORAL | 2 refills | Status: AC
  Filled 2021-08-08: qty 90, 90d supply, fill #0
  Filled 2021-11-06: qty 90, 90d supply, fill #1

## 2021-08-08 MED ORDER — OZEMPIC (1 MG/DOSE) 4 MG/3ML ~~LOC~~ SOPN
1.0000 mg | PEN_INJECTOR | SUBCUTANEOUS | 0 refills | Status: DC
  Filled 2021-08-08 – 2021-10-03 (×2): qty 3, 28d supply, fill #0

## 2021-08-08 MED ORDER — IPRATROPIUM BROMIDE 0.03 % NA SOLN
2.0000 | Freq: Two times a day (BID) | NASAL | 12 refills | Status: AC
  Filled 2021-08-08: qty 30, 75d supply, fill #0

## 2021-08-08 MED ORDER — METOPROLOL TARTRATE 25 MG PO TABS
ORAL_TABLET | Freq: Two times a day (BID) | ORAL | 1 refills | Status: AC
  Filled 2021-08-08: qty 180, 90d supply, fill #0
  Filled 2021-11-06: qty 180, 90d supply, fill #1

## 2021-08-08 NOTE — Progress Notes (Signed)
?Jeanette Meyer is a 46 y.o. female with the following history as recorded in [employer]:  ?Patient Active Problem List  ? Diagnosis Date Noted  ? Dyslipidemia 01/27/2019  ? At risk for cardiovascular event 10/28/2018  ? Abscess of pubic region 04/29/2018  ? Essential hypertension 01/17/2018  ? Uncontrolled type 2 diabetes mellitus 01/17/2018  ? H/O rectal polypectomy 01/17/2018  ? Fibroid uterus 11/06/2017  ? Abnormal uterine bleeding (AUB) 11/06/2017  ? Abscess of groin, left 11/06/2017  ?  ?Current Outpatient Medications  ?Medication Sig Dispense Refill  ? Blood Glucose Monitoring Suppl (BLOOD GLUCOSE MONITOR SYSTEM) w/Device KIT Dispense Freestyle meter or per insurance coverage. Check glucose once daily - ICD 10- E11.65 1 each 0  ? cetirizine (ZYRTEC) 10 MG tablet Take 10 mg by mouth daily.    ? Continuous Blood Gluc Receiver (FREESTYLE LIBRE 2 READER) DEVI Use as directed to check blood sugar 1 each 3  ? Continuous Blood Gluc Sensor (FREESTYLE LIBRE 2 SENSOR) MISC Use as directed every 14 (fourteen) days. 2 each 6  ? ELDERBERRY PO Take by mouth daily. Uses pill and syrup    ? glucose blood test strip Check fasting blood sugar once daily. 150 each 12  ? Insulin Lispro Prot & Lispro (HUMALOG MIX 75/25 KWIKPEN) (75-25) 100 UNIT/ML Kwikpen Inject 24 Units into the skin 2 (two) times daily with a meal. 15 mL 2  ? Insulin Pen Needle (PEN NEEDLES) 32G X 4 MM MISC 1 Device by Does not apply route 2 (two) times daily with a meal. 200 each 3  ? ipratropium (ATROVENT) 0.03 % nasal spray Place 2 sprays into both nostrils every 12 (twelve) hours. 30 mL 12  ? Iron Polysacch Cmplx-B12-FA (POLY-IRON 150 FORTE) 150-0.025-1 MG CAPS Take daily as directed 100 capsule 0  ? metFORMIN (GLUCOPHAGE-XR) 750 MG 24 hr tablet Take 1 tablet (750 mg total) by mouth daily with breakfast. 90 tablet 3  ? Multiple Vitamin (MULTIVITAMIN) tablet Take 1 tablet by mouth daily.    ? naproxen (NAPROSYN) 500 MG tablet Take 1 tablet (500 mg total) by  mouth 2 (two) times daily as needed. 60 tablet 1  ? pravastatin (PRAVACHOL) 10 MG tablet Take 10 mg by mouth daily.    ? Probiotic Product (PROBIOTIC PO) Take by mouth daily.    ? rizatriptan (MAXALT) 10 MG tablet Take 1 tablet (10 mg total) by mouth as needed for migraine. May repeat in 2 hours if needed 10 tablet 1  ? amLODipine (NORVASC) 5 MG tablet TAKE 1 TABLET (5 MG TOTAL) BY MOUTH DAILY. 90 tablet 2  ? lisinopril (ZESTRIL) 5 MG tablet Take 1 tablet (5 mg total) by mouth daily. 90 tablet 1  ? metoprolol tartrate (LOPRESSOR) 25 MG tablet TAKE 1 TABLET BY MOUTH 2 TIMES DAILY. 180 tablet 1  ? Semaglutide, 1 MG/DOSE, (OZEMPIC, 1 MG/DOSE,) 4 MG/3ML SOPN Inject 1 mg into the skin once a week. 3 mL 0  ? ?No current facility-administered medications for this visit.  ?  ?Allergies: Bactrim [sulfamethoxazole-trimethoprim], Clarithromycin, Doxycycline, and Latex  ?Past Medical History:  ?Diagnosis Date  ? Diabetes mellitus without complication ([place])   ? Fibroid   ? GERD (gastroesophageal reflux disease)   ? Hypertension   ?  ?Past Surgical History:  ?Procedure Laterality Date  ? boil removal  02/2019  ? [facility]  ? COLONOSCOPY    ? 2002-2004  ? MYOMECTOMY    ? RECTAL POLYPECTOMY    ? 2002-2004  ?  ?  Family History  ?Problem Relation Age of Onset  ? Diabetes Mother   ? Hypertension Mother   ? Diabetes Maternal Grandmother   ? Cancer Maternal Grandmother   ? Hypertension Maternal Grandmother   ? Liver cancer Maternal Grandmother   ? Crohn's disease Maternal Uncle   ? Colon cancer Neg Hx   ? Esophageal cancer Neg Hx   ?  ?Social History  ? ?Tobacco Use  ? Smoking status: Never  ? Smokeless tobacco: Never  ?Substance Use Topics  ? Alcohol use: Yes  ?  ?Subjective:  ? ?6 month follow up on hypertension; also due to see her endocrinologist for diabetes follow up;  ?Denies any chest pain, shortness of breath, blurred vision or headache ? ? ? ? ?Objective:  ?Vitals:  ? 08/08/21 0819 08/08/21 0849  ?BP: (!) 146/82  130/78  ?Pulse: 95   ?Resp: 18   ?Temp: 98 ?F (36.7 ?C)   ?TempSrc: Oral   ?SpO2: 98%   ?Weight: 209 lb 3.2 oz (94.9 kg)   ?Height: 5' 4" (1.626 m)   ?  ?General: Well developed, well nourished, in no acute distress  ?Skin : Warm and dry.  ?Head: Normocephalic and atraumatic  ?Lungs: Respirations unlabored; clear to auscultation bilaterally without wheeze, rales, rhonchi  ?CVS exam: normal rate and regular rhythm.  ?Neurologic: Alert and oriented; speech intact; face symmetrical; moves all extremities well; CNII-XII intact without focal deficit  ? ?Assessment:  ?1. Essential hypertension   ?2. Uncontrolled type 2 diabetes mellitus with hyperglycemia (HCC)   ?3. Dyslipidemia   ?4. Allergic rhinitis, unspecified seasonality, unspecified trigger   ?  ?Plan:  ?Increase Lisinopril to 5 mg daily; continue Amlodipine and Lopressor; follow up in 6 months; ?Refill updated for Ozempic; stressed need for her to follow up with her endocrinologist- was due for OV in January 2023; ?Stressed to try and take Pravachol with regularity; patient has been able to tolerate when taking 1-2 x per week;  ?Trial of Ipatropium nasal spray;  ? ?This visit occurred during the SARS-CoV-2 public health emergency.  Safety protocols were in place, including screening questions prior to the visit, additional usage of staff PPE, and extensive cleaning of exam room while observing appropriate contact time as indicated for disinfecting solutions.  ? ? ?No follow-ups on file.  ?No orders of the defined types were placed in this encounter. ?  ?Requested Prescriptions  ? ?Signed Prescriptions Disp Refills  ? Semaglutide, 1 MG/DOSE, (OZEMPIC, 1 MG/DOSE,) 4 MG/3ML SOPN 3 mL 0  ?  Sig: Inject 1 mg into the skin once a week.  ? amLODipine (NORVASC) 5 MG tablet 90 tablet 2  ?  Sig: TAKE 1 TABLET (5 MG TOTAL) BY MOUTH DAILY.  ? metoprolol tartrate (LOPRESSOR) 25 MG tablet 180 tablet 1  ?  Sig: TAKE 1 TABLET BY MOUTH 2 TIMES DAILY.  ? lisinopril (ZESTRIL) 5 MG  tablet 90 tablet 1  ?  Sig: Take 1 tablet (5 mg total) by mouth daily.  ? ipratropium (ATROVENT) 0.03 % nasal spray 30 mL 12  ?  Sig: Place 2 sprays into both nostrils every 12 (twelve) hours.  ?  ? ?

## 2021-08-08 NOTE — Patient Instructions (Signed)
You are overdue to see your endocrinologist; Please call to schedule a follow up with Dr. Kelton Pillar;  ?

## 2021-08-14 ENCOUNTER — Other Ambulatory Visit (HOSPITAL_COMMUNITY): Payer: Self-pay

## 2021-08-14 ENCOUNTER — Other Ambulatory Visit: Payer: Self-pay | Admitting: Internal Medicine

## 2021-08-14 MED ORDER — INSULIN LISPRO PROT & LISPRO (75-25 MIX) 100 UNIT/ML KWIKPEN
24.0000 [IU] | PEN_INJECTOR | Freq: Two times a day (BID) | SUBCUTANEOUS | 0 refills | Status: DC
  Filled 2021-08-14 (×2): qty 15, 31d supply, fill #0

## 2021-08-18 ENCOUNTER — Other Ambulatory Visit (HOSPITAL_COMMUNITY): Payer: Self-pay

## 2021-08-22 ENCOUNTER — Encounter: Payer: Self-pay | Admitting: Podiatry

## 2021-08-24 ENCOUNTER — Encounter: Payer: Self-pay | Admitting: Podiatry

## 2021-08-24 ENCOUNTER — Ambulatory Visit
Admission: RE | Admit: 2021-08-24 | Discharge: 2021-08-24 | Disposition: A | Discharge status: Home / Self Care | Payer: 59 | Source: Ambulatory Visit | Attending: Family | Admitting: Family

## 2021-08-24 ENCOUNTER — Other Ambulatory Visit: Payer: Self-pay | Admitting: Family

## 2021-08-24 DIAGNOSIS — N6311 Unspecified lump in the right breast, upper outer quadrant: Secondary | ICD-10-CM | POA: Diagnosis not present

## 2021-08-24 DIAGNOSIS — N6489 Other specified disorders of breast: Secondary | ICD-10-CM

## 2021-08-24 DIAGNOSIS — N6310 Unspecified lump in the right breast, unspecified quadrant: Secondary | ICD-10-CM

## 2021-08-26 ENCOUNTER — Encounter: Payer: Self-pay | Admitting: Family

## 2021-09-01 ENCOUNTER — Encounter: Payer: Self-pay | Admitting: Internal Medicine

## 2021-09-01 ENCOUNTER — Other Ambulatory Visit (HOSPITAL_BASED_OUTPATIENT_CLINIC_OR_DEPARTMENT_OTHER): Payer: Self-pay

## 2021-09-01 ENCOUNTER — Ambulatory Visit: Payer: 59 | Attending: Internal Medicine

## 2021-09-01 ENCOUNTER — Ambulatory Visit: Payer: 59 | Admitting: Internal Medicine

## 2021-09-01 VITALS — BP 126/84 | HR 64 | Temp 97.7°F | Resp 16 | Ht 64.0 in | Wt 214.4 lb

## 2021-09-01 DIAGNOSIS — M5412 Radiculopathy, cervical region: Secondary | ICD-10-CM | POA: Diagnosis not present

## 2021-09-01 DIAGNOSIS — Z23 Encounter for immunization: Secondary | ICD-10-CM

## 2021-09-01 MED ORDER — CYCLOBENZAPRINE HCL 10 MG PO TABS
10.0000 mg | ORAL_TABLET | Freq: Every evening | ORAL | 0 refills | Status: AC | PRN
  Filled 2021-09-01: qty 21, 21d supply, fill #0

## 2021-09-01 MED ORDER — NAPROXEN 500 MG PO TABS
500.0000 mg | ORAL_TABLET | Freq: Two times a day (BID) | ORAL | 0 refills | Status: DC | PRN
  Filled 2021-09-01: qty 40, 20d supply, fill #0

## 2021-09-01 NOTE — Progress Notes (Signed)
? ?Subjective:  ? ? Patient ID: Jeanette Meyer, female    DOB: Oct 18, 1975, 46 y.o.   MRN: [id number] ? ?DOS:  09/01/2021 ?Type of visit - description: acute ? ?Symptoms a started 3 days ago while she was sitting on her car. ?Pain at the right side of the neck, right trapezoid, right shoulder radiating down to the right thumb.  The thumb is slightly numb. ?Does not recall any injury, fall or incident that triggered the sxs. ?Denies any lower extremity paresthesias or gait difficulties other than having problems with her knees. ? ? ?Review of Systems ?See above  ? ?Past Medical History:  ?Diagnosis Date  ? Diabetes mellitus without complication ([place])   ? Fibroid   ? GERD (gastroesophageal reflux disease)   ? Hypertension   ? ? ?Past Surgical History:  ?Procedure Laterality Date  ? boil removal  02/2019  ? [facility]  ? COLONOSCOPY    ? 2002-2004  ? MYOMECTOMY    ? RECTAL POLYPECTOMY    ? 2002-2004  ? ? ?Current Outpatient Medications  ?Medication Instructions  ? amLODipine (NORVASC) 5 MG tablet TAKE 1 TABLET (5 MG TOTAL) BY MOUTH DAILY.  ? Blood Glucose Monitoring Suppl (BLOOD GLUCOSE MONITOR SYSTEM) w/Device KIT Dispense Freestyle meter or per insurance coverage. Check glucose once daily - ICD 10- E11.65  ? cetirizine (ZYRTEC) 10 mg, Oral, Daily  ? Continuous Blood Gluc Receiver (FREESTYLE LIBRE 2 READER) DEVI Use as directed to check blood sugar  ? Continuous Blood Gluc Sensor (FREESTYLE LIBRE 2 SENSOR) MISC Use as directed every 14 (fourteen) days.  ? ELDERBERRY PO Oral, Daily, Uses pill and syrup   ? glucose blood test strip Check fasting blood sugar once daily.  ? Insulin Lispro Prot & Lispro (HUMALOG MIX 75/25 KWIKPEN) (75-25) 100 UNIT/ML Kwikpen 24 Units, Subcutaneous, 2 times daily with meals  ? Insulin Pen Needle (PEN NEEDLES) 32G X 4 MM MISC 1 Device, Does not apply, 2 times daily with meals  ? ipratropium (ATROVENT) 0.03 % nasal spray 2 sprays, Each Nare, Every 12 hours  ? Iron Polysacch  Cmplx-B12-FA (POLY-IRON 150 FORTE) 150-0.025-1 MG CAPS Take daily as directed  ? lisinopril (ZESTRIL) 5 mg, Oral, Daily  ? metFORMIN (GLUCOPHAGE-XR) 750 mg, Oral, Daily with breakfast  ? metoprolol tartrate (LOPRESSOR) 25 MG tablet TAKE 1 TABLET BY MOUTH 2 TIMES DAILY.  ? Multiple Vitamin (MULTIVITAMIN) tablet 1 tablet, Oral, Daily  ? naproxen (NAPROSYN) 500 mg, Oral, 2 times daily PRN  ? Ozempic (1 MG/DOSE) 1 mg, Subcutaneous, Weekly  ? pravastatin (PRAVACHOL) 10 mg, Oral, Daily  ? Probiotic Product (PROBIOTIC PO) Oral, Daily  ? rizatriptan (MAXALT) 10 mg, Oral, As needed, May repeat in 2 hours if needed  ? ? ?   ?Objective:  ? Physical Exam ?BP 126/84 (BP Location: Left Arm, Patient Position: Sitting, Cuff Size: Normal)   Pulse 64   Temp 97.7 ?F (36.5 ?C) (Oral)   Resp 16   Ht _0  (1.626 m)   Wt 214 lb 6 oz (97.2 kg)   LMP 07/26/2021 (Approximate)   SpO2 98%   BMI 36.80 kg/m?  ?General:   ?Well developed, NAD, BMI noted. ?HEENT:  ?Normocephalic . Face symmetric, atraumatic ?Neck: ?No TTP of the cervical spine.  Range of motion normal except for slightly limited hyperextension and turning the head today right. ?Skin: Not pale. Not jaundice ?Neurologic:  ?alert & oriented X3.  ?Speech normal, gait appropriate for age and unassisted ?Motor symmetric.  DTRs symmetrically decreased at the lower extremities, normal DTRs upper extremities. ?Psych--  ?Cognition and judgment appear intact.  ?Cooperative with normal attention span and concentration.  ?Behavior appropriate. ?No anxious or depressed appearing.  ? ?   ?Assessment   ? ?  ?46 year old female, PMH includes hypertension, diabetes, high cholesterol, presents with ?Radiculopathy: ?Suspect symptoms related to neck radiculopathy, probably C6. ?She has uncontrolled DM with A1c of 7.9, history of good tolerance to naproxen. ?Plan: Tylenol, alternate with naproxen, strict GI precautions discussed.  Avoid long-term use of NSAIDs. ?Flexeril at night, heat/cold  compresses. ?If not improving either let us know or reach out to [facility] where she is already a patient. ?She verbalized understanding. ? ? ?This visit occurred during the SARS-CoV-2 public health emergency.  Safety protocols were in place, including screening questions prior to the visit, additional usage of staff PPE, and extensive cleaning of exam room while observing appropriate contact time as indicated for disinfecting solutions.  ? ?

## 2021-09-01 NOTE — Patient Instructions (Signed)
Tylenol  500 mg OTC 2 tabs a day every 8 hours as needed for pain ? ?Take NAPROXEN as needed for pain.  ?Always take it with food because may cause gastritis and ulcers.  ?If you notice nausea, stomach pain, change in the color of stools --->  Stop the medicine and let us know ? ?Flexeril at bedtime ? ?Heat or cold compress  ? ? ? ?

## 2021-09-01 NOTE — Progress Notes (Signed)
? ?  [facility] ? ?Name:  Jeanette Meyer    ?MRN: [id number] ?DOB: February 12, 1976 ? ?09/01/2021 ? ?Ms. Jeanette Meyer was observed post Covid-19 immunization for 15 minutes without incident. She was provided with Vaccine Information Sheet and instruction to access the V-Safe system.  ? ?Ms. Jeanette Meyer was instructed to call 911 with any severe reactions post vaccine: ?Difficulty breathing  ?Swelling of face and throat  ?A fast heartbeat  ?A bad rash all over body  ?Dizziness and weakness  ? ?Immunizations Administered   ? ? Name Date Dose VIS Date Route  ? Ambulance person Booster 09/01/2021 10:07 AM 0.3 mL 01/18/2021 Intramuscular  ? Manufacturer: [place]: [id number]  ? [place]: [phone]  ? ?  ? ? ?

## 2021-09-04 ENCOUNTER — Other Ambulatory Visit (HOSPITAL_BASED_OUTPATIENT_CLINIC_OR_DEPARTMENT_OTHER): Payer: Self-pay

## 2021-09-04 MED ORDER — PFIZER COVID-19 VAC BIVALENT 30 MCG/0.3ML IM SUSP
INTRAMUSCULAR | 0 refills | Status: AC
  Filled 2021-09-04: qty 0.3, 1d supply, fill #0

## 2021-09-05 ENCOUNTER — Encounter: Payer: Self-pay | Admitting: Internal Medicine

## 2021-09-18 ENCOUNTER — Other Ambulatory Visit (HOSPITAL_COMMUNITY): Payer: Self-pay

## 2021-09-18 ENCOUNTER — Other Ambulatory Visit: Payer: Self-pay | Admitting: Internal Medicine

## 2021-09-18 MED ORDER — FREESTYLE LIBRE 2 SENSOR MISC
1.0000 | 6 refills | Status: AC
  Filled 2021-09-18: qty 2, 28d supply, fill #0
  Filled 2021-11-06: qty 2, 28d supply, fill #1
  Filled 2021-11-23: qty 2, 28d supply, fill #2
  Filled 2021-12-25: qty 1, 14d supply, fill #3

## 2021-10-03 ENCOUNTER — Other Ambulatory Visit: Payer: Self-pay | Admitting: Internal Medicine

## 2021-10-03 ENCOUNTER — Other Ambulatory Visit (HOSPITAL_BASED_OUTPATIENT_CLINIC_OR_DEPARTMENT_OTHER): Payer: Self-pay

## 2021-10-03 ENCOUNTER — Other Ambulatory Visit (HOSPITAL_COMMUNITY): Payer: Self-pay

## 2021-10-03 MED ORDER — INSULIN LISPRO PROT & LISPRO (75-25 MIX) 100 UNIT/ML KWIKPEN
24.0000 [IU] | PEN_INJECTOR | Freq: Two times a day (BID) | SUBCUTANEOUS | 1 refills | Status: DC
  Filled 2021-10-03: qty 27, 56d supply, fill #0

## 2021-11-01 ENCOUNTER — Encounter: Payer: Self-pay | Admitting: Podiatry

## 2021-11-02 NOTE — Telephone Encounter (Signed)
Please advise 

## 2021-11-07 ENCOUNTER — Other Ambulatory Visit (HOSPITAL_COMMUNITY): Payer: Self-pay

## 2021-11-08 ENCOUNTER — Other Ambulatory Visit (HOSPITAL_COMMUNITY): Payer: Self-pay

## 2021-11-09 ENCOUNTER — Other Ambulatory Visit (HOSPITAL_COMMUNITY): Payer: Self-pay

## 2021-11-09 ENCOUNTER — Other Ambulatory Visit: Payer: Self-pay | Admitting: Podiatry

## 2021-11-09 MED ORDER — DICLOFENAC SODIUM 75 MG PO TBEC
75.0000 mg | DELAYED_RELEASE_TABLET | Freq: Two times a day (BID) | ORAL | 0 refills | Status: DC
  Filled 2021-11-09: qty 30, 15d supply, fill #0

## 2021-11-10 ENCOUNTER — Other Ambulatory Visit (HOSPITAL_COMMUNITY): Payer: Self-pay

## 2021-11-23 ENCOUNTER — Encounter: Payer: Self-pay | Admitting: Internal Medicine

## 2021-11-23 ENCOUNTER — Other Ambulatory Visit: Payer: Self-pay | Admitting: Podiatry

## 2021-11-23 ENCOUNTER — Ambulatory Visit (INDEPENDENT_AMBULATORY_CARE_PROVIDER_SITE_OTHER): Payer: 59 | Admitting: Internal Medicine

## 2021-11-23 ENCOUNTER — Other Ambulatory Visit (HOSPITAL_COMMUNITY): Payer: Self-pay

## 2021-11-23 VITALS — BP 126/80 | HR 100 | Ht 64.0 in | Wt 214.0 lb

## 2021-11-23 DIAGNOSIS — E1165 Type 2 diabetes mellitus with hyperglycemia: Secondary | ICD-10-CM | POA: Diagnosis not present

## 2021-11-23 DIAGNOSIS — Z794 Long term (current) use of insulin: Secondary | ICD-10-CM | POA: Diagnosis not present

## 2021-11-23 LAB — POCT GLYCOSYLATED HEMOGLOBIN (HGB A1C): Hemoglobin A1C: 8.4 % — AB (ref 4.0–5.6)

## 2021-11-23 MED ORDER — SEMAGLUTIDE (2 MG/DOSE) 8 MG/3ML ~~LOC~~ SOPN
2.0000 mg | PEN_INJECTOR | SUBCUTANEOUS | 3 refills | Status: AC
  Filled 2021-11-23: qty 9, 84d supply, fill #0

## 2021-11-23 MED ORDER — METFORMIN HCL ER 750 MG PO TB24
750.0000 mg | ORAL_TABLET | Freq: Every day | ORAL | 3 refills | Status: AC
  Filled 2021-11-23: qty 90, 90d supply, fill #0

## 2021-11-23 MED ORDER — INSULIN LISPRO PROT & LISPRO (75-25 MIX) 100 UNIT/ML KWIKPEN
24.0000 [IU] | PEN_INJECTOR | Freq: Two times a day (BID) | SUBCUTANEOUS | 3 refills | Status: AC
  Filled 2021-11-23: qty 42, 88d supply, fill #0

## 2021-11-23 NOTE — Patient Instructions (Addendum)
-   Increase Ozempic 2 mg weekly  - Continue  Humalog Mix 24 units twice daily  - Continue Metformin 750 mg XR daily      HOW TO TREAT LOW BLOOD SUGARS (Blood sugar LESS THAN 70 MG/DL) Please follow the RULE OF 15 for the treatment of hypoglycemia treatment (when your (blood sugars are less than 70 mg/dL)   STEP 1: Take 15 grams of carbohydrates when your blood sugar is low, which includes:  3-4 GLUCOSE TABS  OR 3-4 OZ OF JUICE OR REGULAR SODA OR ONE TUBE OF GLUCOSE GEL    STEP 2: RECHECK blood sugar in 15 MINUTES STEP 3: If your blood sugar is still low at the 15 minute recheck --> then, go back to STEP 1 and treat AGAIN with another 15 grams of carbohydrates.

## 2021-11-23 NOTE — Progress Notes (Signed)
Name: Jeanette Meyer  Age/ Sex: 46 y.o., female   MRN/ DOB: [id number], 1976-04-27     PCP: Marrian Salvage, [place]   Reason for Endocrinology Evaluation: Type 2 Diabetes Mellitus  Initial Endocrine Consultative Visit: 03/07/18    PATIENT IDENTIFIER: Jeanette Meyer is a 46 y.o. female with a past medical history of HTN and DM. The patient has followed with Endocrinology clinic since 03/07/18 for consultative assistance with management of her diabetes.    DIABETIC HISTORY:  Ms. Bales was diagnosed with T2DM at age 67. She was initially on MDI insulin regimen ~ months. With lifestyle changes and weight loss she was able to come off insulin. She was switched to oral glycemic agents at the time. Over the years she has tried Trulitcity which caused palpitations, Januvia caused joint pains. She has also tried [place] but due to a single episode of UTI patient states this was discontinued but we restarted it 10/2018. Lantus was added in August, 2019 to her regimen . Her hemoglobin A1c was 12.2% on her presentation to our clinic   Prandial insulin started 02/2018  She is intolerant to BID dosing of metformin   Switched MDI regimen to insulin mix 04/2019  Ozempic started 05/2020  Stopped Farxiga due to a single " bad" uti " in 05/2021  SUBJECTIVE:   During the last visit (02/07/2021): A1c 7.9 %. Continued metformin, farxiga and insulin mix.     Today (11/23/2021): [name] is here for a follow up on her diabetes management.She checks her blood sugars multiple times through CGM . The patient has not  had hypoglycemic episodes since the last clinic visit, occur while sleeping     Denies nausea, vomiting or  diarrhea  Has multiple arthralgias   HOME DIABETES REGIMEN:  Humalog Mix 24 units QAM and 24 units QPM Metformin 750 XR mg daily in the evening   Ozempic 1 mg weekly      CONTINUOUS GLUCOSE MONITORING RECORD INTERPRETATION    Dates of Recording:  6/23-11/23/2021  Sensor description:freestyle libre   Results statistics:   CGM use % of time 45  Average and SD 186/31.1  Time in range      47  %  % Time Above 180 38  % Time above 250 15  % Time Below target 0      Glycemic patterns summary: Bg's fluctuate with hyperglycemia after meal and at goal between meals   Hyperglycemic episodes  postprandial   Hypoglycemic episodes occurred n/a  Overnight periods: high         DIABETIC COMPLICATIONS: Microvascular complications:  Retinopathy Denies: Neuropathy, nephropathy  Last eye exam: Completed in 08/2021     Macrovascular complications:    Denies: CAD, CVA, PVD        HISTORY:  Past Medical History:  Past Medical History:  Diagnosis Date   Diabetes mellitus without complication ([place])    Fibroid    GERD (gastroesophageal reflux disease)    Hypertension    Past Surgical History:  Past Surgical History:  Procedure Laterality Date   boil removal  02/2019   [facility]   COLONOSCOPY     2002-2004   MYOMECTOMY     RECTAL POLYPECTOMY     2002-2004   Social History:  reports that she has never smoked. She has never used smokeless tobacco. She reports current alcohol use. She reports that she does not use drugs. Family History:  Family History  Problem Relation Age of Onset   Diabetes Mother    Hypertension Mother    Diabetes Maternal Grandmother    Cancer Maternal Grandmother    Hypertension Maternal Grandmother    Liver cancer Maternal Grandmother    Crohn's disease Maternal Uncle    Colon cancer Neg Hx    Esophageal cancer Neg Hx      HOME MEDICATIONS: Allergies as of 11/23/2021       Reactions   Bactrim [sulfamethoxazole-trimethoprim] Diarrhea, Nausea And Vomiting   Clarithromycin Anaphylaxis   Doxycycline Anaphylaxis   Latex         Medication List        Accurate as of November 23, 2021  9:50 AM. If you have any questions, ask your nurse or doctor.          STOP  taking these medications    naproxen 500 MG tablet Commonly known as: NAPROSYN Stopped by: Dorita Sciara, MD       TAKE these medications    amLODipine 5 MG tablet Commonly known as: NORVASC TAKE 1 TABLET (5 MG TOTAL) BY MOUTH DAILY.   Blood Glucose Monitor System w/Device Kit Dispense Freestyle meter or per insurance coverage. Check glucose once daily - ICD 10- E11.65   cetirizine 10 MG tablet Commonly known as: ZYRTEC Take 10 mg by mouth daily.   cyclobenzaprine 10 MG tablet Commonly known as: FLEXERIL Take 1 tablet (10 mg total) by mouth at bedtime as needed for muscle spasms.   diclofenac 75 MG EC tablet Commonly known as: VOLTAREN Take 1 tablet (75 mg total) by mouth 2 (two) times daily.   ELDERBERRY PO Take by mouth daily. Uses pill and syrup   FreeStyle Libre 2 Reader [facility] Use as directed to check blood sugar   FreeStyle Libre 2 Sensor Misc Use as directed every 14 (fourteen) days.   glucose blood test strip Check fasting blood sugar once daily.   HumaLOG Mix 75/25 KwikPen (75-25) 100 UNIT/ML Kwikpen Generic drug: Insulin Lispro Prot & Lispro Inject 24 Units into the skin 2 (two) times daily with a meal.   ipratropium 0.03 % nasal spray Commonly known as: ATROVENT Place 2 sprays into both nostrils every 12 (twelve) hours.   lisinopril 5 MG tablet Commonly known as: Zestril Take 1 tablet (5 mg total) by mouth daily.   metFORMIN 750 MG 24 hr tablet Commonly known as: GLUCOPHAGE-XR Take 1 tablet (750 mg total) by mouth daily with breakfast.   metoprolol tartrate 25 MG tablet Commonly known as: LOPRESSOR TAKE 1 TABLET BY MOUTH 2 TIMES DAILY.   multivitamin tablet Take 1 tablet by mouth daily.   Ozempic (1 MG/DOSE) 4 MG/3ML Sopn Generic drug: Semaglutide (1 MG/DOSE) Inject 1 mg into the skin once a week.   Pen Needles 32G X 4 MM Misc 1 Device by Does not apply route 2 (two) times daily with a meal.   Pfizer COVID-19 Vac Bivalent  injection Generic drug: COVID-19 mRNA bivalent vaccine (Pfizer) Inject into the muscle.   Poly-Iron 150 Forte 150-0.025-1 MG Caps Generic drug: Iron Polysacch Cmplx-B12-FA Take daily as directed   pravastatin 10 MG tablet Commonly known as: PRAVACHOL Take 10 mg by mouth daily.   PROBIOTIC PO Take by mouth daily.   rizatriptan 10 MG tablet Commonly known as: Maxalt Take 1 tablet (10 mg total) by mouth as needed for migraine. May repeat in 2 hours if needed         OBJECTIVE:   Vital  Signs: BP 126/80 (BP Location: Left Arm, Patient Position: Sitting, Cuff Size: Large)   Pulse 100   Ht _0  (1.626 m)   Wt 214 lb (97.1 kg)   SpO2 99%   BMI 36.73 kg/m   Wt Readings from Last 3 Encounters:  11/23/21 214 lb (97.1 kg)  09/01/21 214 lb 6 oz (97.2 kg)  08/08/21 209 lb 3.2 oz (94.9 kg)     Exam: General: Pt appears well and is in NAD  Neck: General: Supple without adenopathy. Thyroid: Thyroid size normal.  No goiter or nodules appreciated.  Lungs: Clear with good BS bilat with no rales, rhonchi, or wheezes  Heart: RRR with normal S1 and S2 and no gallops; no murmurs; no rub  Abdomen: Normoactive bowel sounds, soft, nontender, without masses or organomegaly palpable  Extremities: No pretibial edema.   Neuro: MS is good with appropriate affect, pt is alert and Ox3    DM Foot Exam: 11/23/2021 The skin of the feet is intact without sores or ulcerations. The pedal pulses are 2+ on right and 2+ on left. The sensation is intact to a screening 5.07, 10 gram monofilament bilaterally    DATA REVIEWED:  Lab Results  Component Value Date   HGBA1C 7.9 (A) 02/07/2021   HGBA1C 8.6 (A) 09/27/2020   HGBA1C 9.1 (A) 05/24/2020   Lab Results  Component Value Date   MICROALBUR 1.0 10/13/2020   [place] 85 10/13/2020   CREATININE 0.72 10/13/2020      ASSESSMENT / PLAN / RECOMMENDATIONS:   1) Type 2 Diabetes Mellitus, poorly controlled , without complications - Most recent  A1c of 8.4  %. Goal A1c < 7.0%.      - A1c has trended up , this is due to discontinuation of Farxiga  - Intolerant to farxiga due to UTI  - She has not  been interested in insulin pumps  - She  has tried trulicity in the past and developed palpitations ,tolerating Ozempic will increase  - Intolerant to BID dosing of metformin   MEDICATIONS:  Continue Humalog Mix 24 units With Breakfast and 24 units with Supper  Continue Metformin 750 mg XR Once daily  Increase  Ozempic 2 mg weekly        EDUCATION / INSTRUCTIONS: BG monitoring instructions: Patient is instructed to check her blood sugars 2 times a day, before breakfast and supper . Call [facility] if: BG persistently < 70  I reviewed the Rule of 15 for the treatment of hypoglycemia in detail with the patient. Literature supplied.      2) Diabetic complications:  Eye: Does not have known diabetic retinopathy. Pt urged to have an eye exam  Neuro/ Feet: does not  have known diabetic peripheral neuropathy . Renal: Patient does not have known baseline CKD.  She is on an ACEI/ARB at present.     3) Dyslipidemia:  -She is not on statins due to joint pains, she was on Atorvastatin and pravastatin which helped with resolution of joint pain.  Medication   Pravastatin 10 mg once weekly       F/U in 4 months    Signed electronically by: Mack Guise, MD  [facility]  [address] [place], [place] [address] Phone: [phone] FAX: [phone]   CC: Marrian Salvage, [place] [address] [place] [address] Phone: [phone]  Fax: [phone]  Return to Endocrinology clinic as below: Future Appointments  Date Time Provider [place]  11/23/2021 10:10 AM Rosland Riding, Melanie Crazier, MD [facility] None  02/09/2022  8:20 AM Marrian Salvage, FNP [facility]  02/26/2022  7:40 AM [facility] DIAG TOMO 1  [facility] GI-BREAST CE  02/26/2022  7:50 AM [facility] [place] 1 [facility] GI-BREAST CE

## 2021-11-24 ENCOUNTER — Other Ambulatory Visit (HOSPITAL_COMMUNITY): Payer: Self-pay

## 2021-11-27 ENCOUNTER — Other Ambulatory Visit: Payer: Self-pay | Admitting: Podiatry

## 2021-11-27 ENCOUNTER — Other Ambulatory Visit (HOSPITAL_COMMUNITY): Payer: Self-pay

## 2021-11-27 MED ORDER — DICLOFENAC SODIUM 75 MG PO TBEC
75.0000 mg | DELAYED_RELEASE_TABLET | Freq: Two times a day (BID) | ORAL | 0 refills | Status: AC
  Filled 2021-11-27: qty 30, 15d supply, fill #0

## 2021-11-29 ENCOUNTER — Telehealth: Payer: Self-pay | Admitting: Podiatry

## 2021-12-08 ENCOUNTER — Ambulatory Visit: Payer: 59 | Admitting: Internal Medicine

## 2021-12-25 ENCOUNTER — Other Ambulatory Visit (HOSPITAL_COMMUNITY): Payer: Self-pay

## 2022-02-07 ENCOUNTER — Encounter: Payer: Self-pay | Admitting: Internal Medicine

## 2022-02-07 ENCOUNTER — Other Ambulatory Visit: Payer: Self-pay | Admitting: Internal Medicine

## 2022-02-07 DIAGNOSIS — E1165 Type 2 diabetes mellitus with hyperglycemia: Secondary | ICD-10-CM

## 2022-02-07 NOTE — Progress Notes (Signed)
Referral placed.

## 2022-02-09 ENCOUNTER — Encounter: Payer: 59 | Admitting: Family

## 2022-02-12 ENCOUNTER — Other Ambulatory Visit (HOSPITAL_COMMUNITY): Payer: Self-pay

## 2022-02-22 ENCOUNTER — Encounter: Payer: Self-pay | Admitting: Family

## 2022-02-22 DIAGNOSIS — N6310 Unspecified lump in the right breast, unspecified quadrant: Secondary | ICD-10-CM

## 2022-02-26 ENCOUNTER — Other Ambulatory Visit: Payer: 59

## 2022-05-29 ENCOUNTER — Ambulatory Visit: Payer: 59 | Admitting: Internal Medicine

## 2022-07-03 NOTE — Telephone Encounter (Signed)
Error

## 2022-10-09 IMAGING — US US BREAST*R* LIMITED INC AXILLA
1 series · 5 of 5 positions shown · non-contrast
Comparison: Previous exam(s).

CLINICAL DATA: First six-month follow-up for probably benign RIGHT
breast mass.

EXAM:
ULTRASOUND OF THE RIGHT BREAST

[Series 1: us breast*right* limited inc axilla · 0.06mm/px · 5 of 5 slices shown]
[im 1/5]
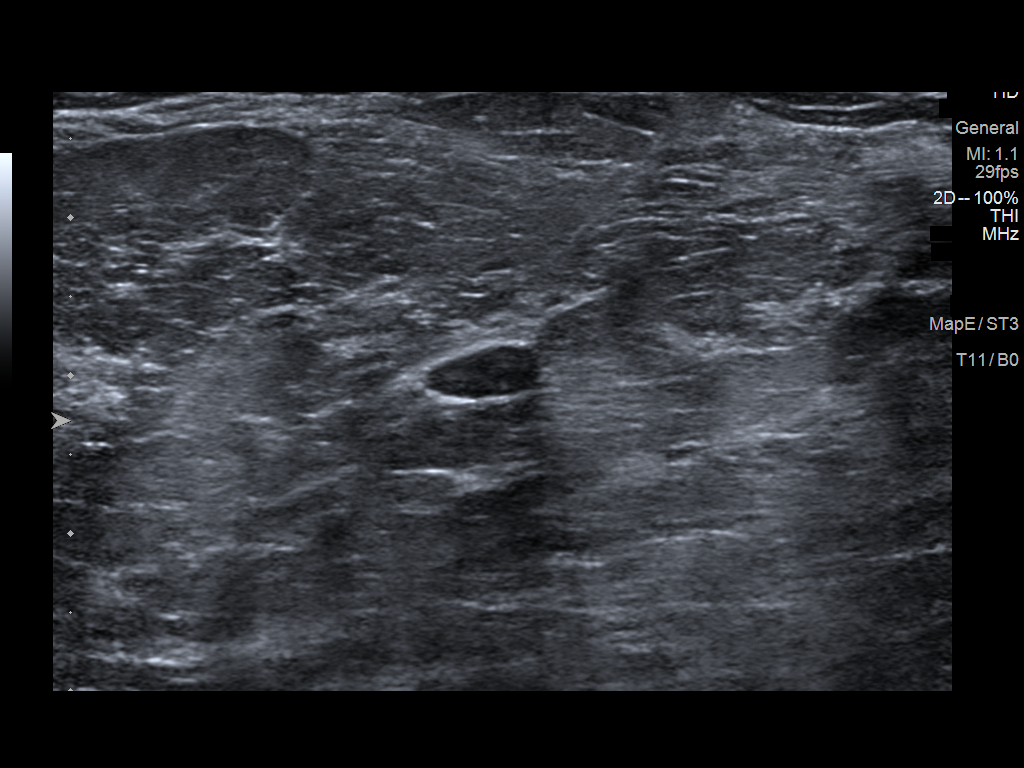
[im 2/5]
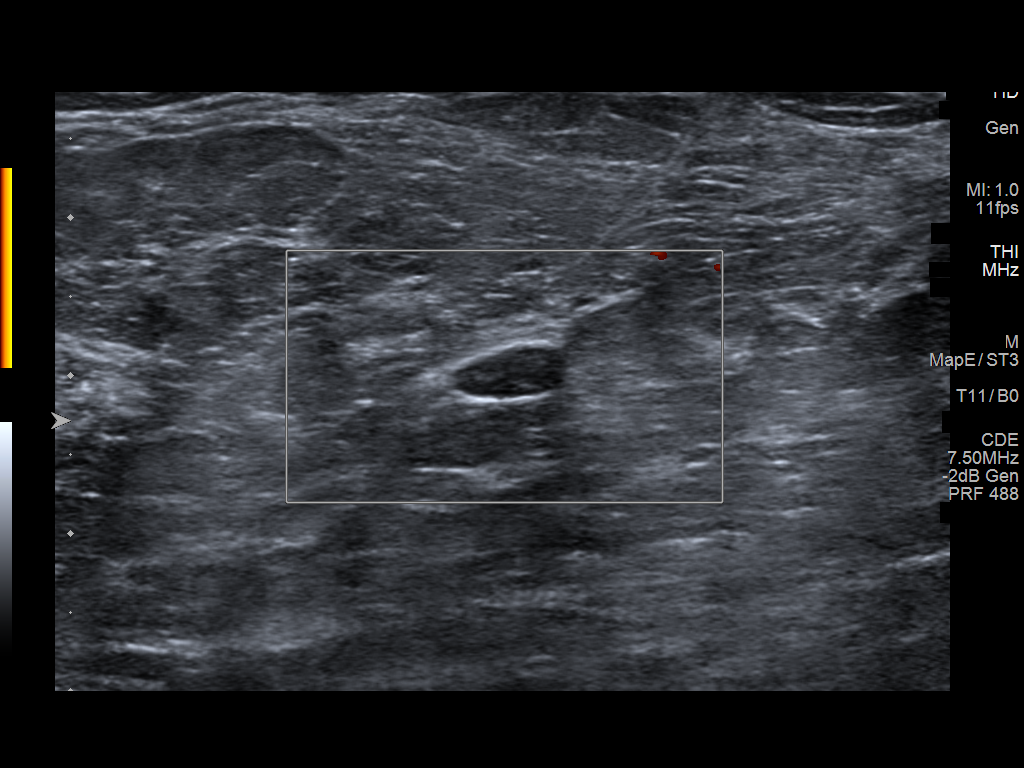
[im 3/5]
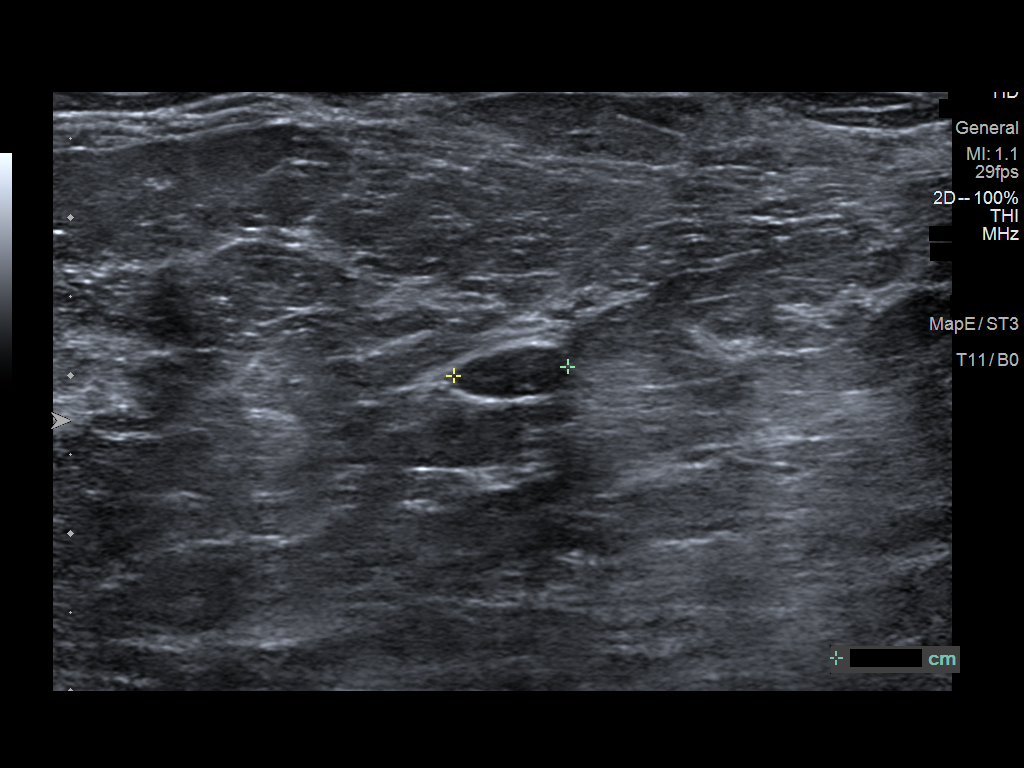
[im 4/5]
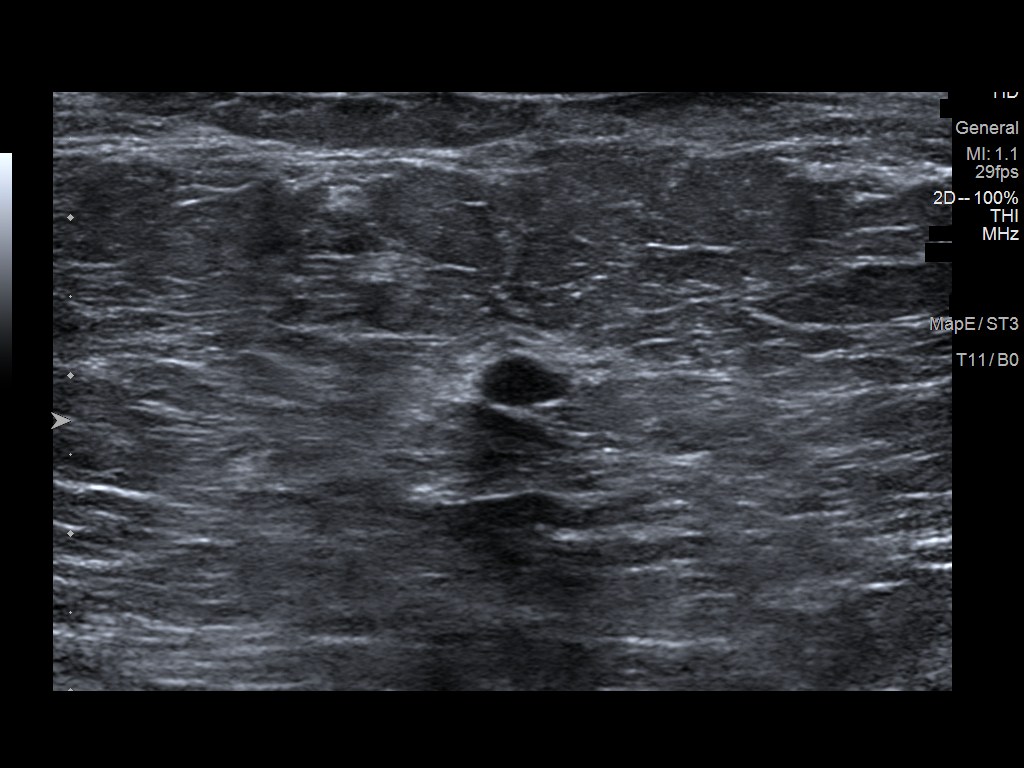
[im 5/5]
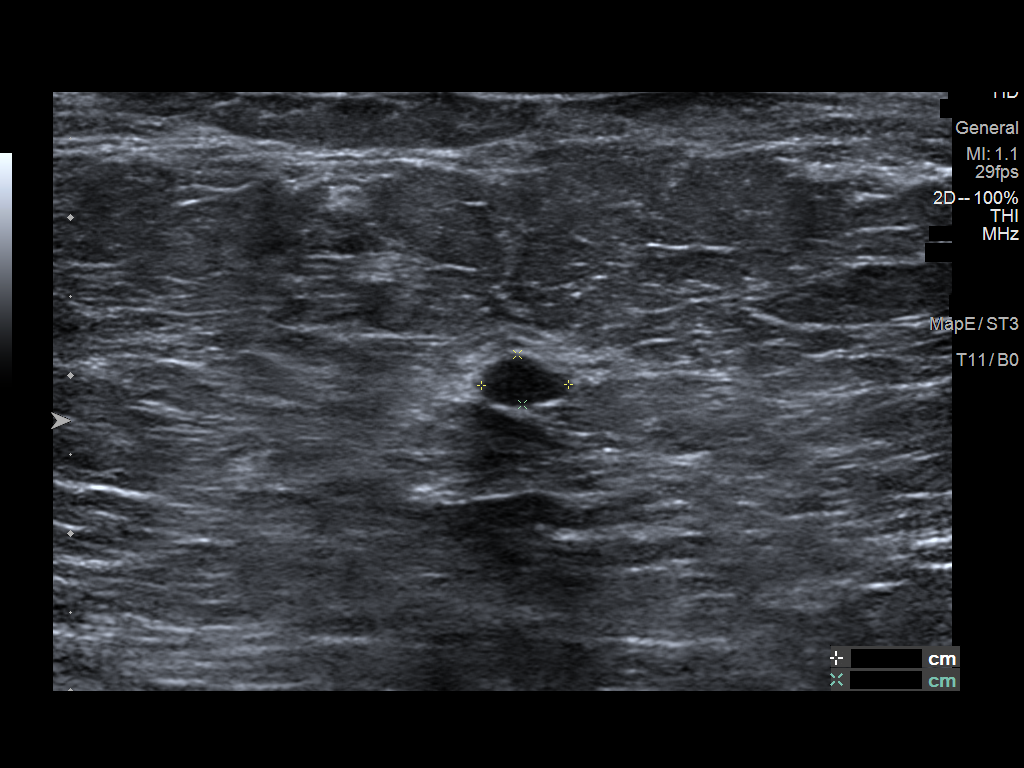

[5 of 5 positions shown; findings below may reference images not displayed]

FINDINGS: Targeted ultrasound is performed, showing a circumscribed oval
hypoechoic parallel mass in the 11 o'clock location of the RIGHT
breast 2 centimeters from the nipple and not associated with
internal blood flow. Mass measures 0.7 x 0.6 x 0.3 centimeters. Mass
is stable compared to prior studies.
IMPRESSION: Stable probably benign mass in the 11 location of the RIGHT breast.

RECOMMENDATION:
Recommend bilateral diagnostic mammogram and RIGHT breast ultrasound
in February 2022.

I have discussed the findings and recommendations with the patient.
If applicable, a reminder letter will be sent to the patient
regarding the next appointment.

BI-RADS CATEGORY  3: Probably benign.
